# Patient Record
Sex: Male | Born: 1985 | Race: White | Hispanic: No | Marital: Single | Smoking: Current every day smoker
Health system: Southern US, Community
[De-identification: ages and names within clinical notes are randomized; demographics above are authoritative.]

## PROBLEM LIST (undated history)

## (undated) DIAGNOSIS — F419 Anxiety disorder, unspecified: Secondary | ICD-10-CM

## (undated) DIAGNOSIS — I10 Essential (primary) hypertension: Secondary | ICD-10-CM

## (undated) DIAGNOSIS — F32A Depression, unspecified: Secondary | ICD-10-CM

## (undated) DIAGNOSIS — F329 Major depressive disorder, single episode, unspecified: Secondary | ICD-10-CM

## (undated) DIAGNOSIS — F4310 Post-traumatic stress disorder, unspecified: Secondary | ICD-10-CM

## (undated) DIAGNOSIS — S6291XA Unspecified fracture of right wrist and hand, initial encounter for closed fracture: Secondary | ICD-10-CM

## (undated) DIAGNOSIS — F411 Generalized anxiety disorder: Secondary | ICD-10-CM

## (undated) DIAGNOSIS — F319 Bipolar disorder, unspecified: Secondary | ICD-10-CM

## (undated) HISTORY — DX: Unspecified fracture of right wrist and hand, initial encounter for closed fracture: S62.91XA

## (undated) HISTORY — PX: TONSILLECTOMY: SUR1361

## (undated) HISTORY — PX: HAND SURGERY: SHX662

## (undated) HISTORY — DX: Anxiety disorder, unspecified: F41.9

---

## 2006-02-16 ENCOUNTER — Emergency Department (HOSPITAL_COMMUNITY): Admission: EM | Admit: 2006-02-16 | Discharge: 2006-02-16 | Payer: Self-pay | Admitting: Family Medicine

## 2013-08-13 ENCOUNTER — Emergency Department (HOSPITAL_COMMUNITY)
Admission: EM | Admit: 2013-08-13 | Discharge: 2013-08-13 | Disposition: A | Discharge status: Home / Self Care | Payer: Self-pay | Attending: Emergency Medicine | Admitting: Emergency Medicine

## 2013-08-13 ENCOUNTER — Emergency Department (HOSPITAL_COMMUNITY): Payer: Self-pay

## 2013-08-13 ENCOUNTER — Encounter (HOSPITAL_COMMUNITY): Payer: Self-pay | Admitting: Emergency Medicine

## 2013-08-13 DIAGNOSIS — R51 Headache: Secondary | ICD-10-CM | POA: Insufficient documentation

## 2013-08-13 DIAGNOSIS — R1012 Left upper quadrant pain: Secondary | ICD-10-CM | POA: Insufficient documentation

## 2013-08-13 DIAGNOSIS — R110 Nausea: Secondary | ICD-10-CM | POA: Insufficient documentation

## 2013-08-13 DIAGNOSIS — J3489 Other specified disorders of nose and nasal sinuses: Secondary | ICD-10-CM | POA: Insufficient documentation

## 2013-08-13 DIAGNOSIS — F17200 Nicotine dependence, unspecified, uncomplicated: Secondary | ICD-10-CM | POA: Insufficient documentation

## 2013-08-13 DIAGNOSIS — R509 Fever, unspecified: Secondary | ICD-10-CM | POA: Insufficient documentation

## 2013-08-13 DIAGNOSIS — K5900 Constipation, unspecified: Secondary | ICD-10-CM | POA: Insufficient documentation

## 2013-08-13 DIAGNOSIS — R21 Rash and other nonspecific skin eruption: Secondary | ICD-10-CM | POA: Insufficient documentation

## 2013-08-13 DIAGNOSIS — J069 Acute upper respiratory infection, unspecified: Secondary | ICD-10-CM | POA: Insufficient documentation

## 2013-08-13 LAB — COMPREHENSIVE METABOLIC PANEL
ALT: 12 U/L (ref 0–53)
AST: 13 U/L (ref 0–37)
Albumin: 4.1 g/dL (ref 3.5–5.2)
Alkaline Phosphatase: 53 U/L (ref 39–117)
BUN: 14 mg/dL (ref 6–23)
Chloride: 102 mEq/L (ref 96–112)
Potassium: 3.8 mEq/L (ref 3.5–5.1)
Sodium: 137 mEq/L (ref 135–145)
Total Bilirubin: 0.7 mg/dL (ref 0.3–1.2)
Total Protein: 7.2 g/dL (ref 6.0–8.3)

## 2013-08-13 LAB — URINALYSIS, ROUTINE W REFLEX MICROSCOPIC
Glucose, UA: NEGATIVE mg/dL
Hgb urine dipstick: NEGATIVE
Ketones, ur: >80 mg/dL — AB
Leukocytes, UA: NEGATIVE
Nitrite: NEGATIVE
Protein, ur: NEGATIVE mg/dL
Specific Gravity, Urine: 1.028 (ref 1.005–1.030)
Urobilinogen, UA: 1 mg/dL (ref 0.0–1.0)
pH: 6 (ref 5.0–8.0)

## 2013-08-13 LAB — CBC WITH DIFFERENTIAL/PLATELET
Basophils Absolute: 0 K/uL (ref 0.0–0.1)
Basophils Relative: 0 % (ref 0–1)
Eosinophils Absolute: 0.1 10*3/uL (ref 0.0–0.7)
Eosinophils Relative: 1 % (ref 0–5)
HCT: 48.3 % (ref 39.0–52.0)
Hemoglobin: 16.8 g/dL (ref 13.0–17.0)
Lymphocytes Relative: 17 % (ref 12–46)
Lymphs Abs: 1.4 K/uL (ref 0.7–4.0)
MCH: 29.5 pg (ref 26.0–34.0)
MCHC: 34.8 g/dL (ref 30.0–36.0)
MCV: 84.9 fL (ref 78.0–100.0)
Monocytes Absolute: 0.8 K/uL (ref 0.1–1.0)
Monocytes Relative: 9 % (ref 3–12)
Neutro Abs: 5.9 10*3/uL (ref 1.7–7.7)
Neutrophils Relative %: 73 % (ref 43–77)
Platelets: 202 10*3/uL (ref 150–400)
RBC: 5.69 MIL/uL (ref 4.22–5.81)
RDW: 13.2 % (ref 11.5–15.5)
WBC: 8.2 K/uL (ref 4.0–10.5)

## 2013-08-13 LAB — COMPREHENSIVE METABOLIC PANEL WITH GFR
CO2: 23 meq/L (ref 19–32)
Calcium: 9.2 mg/dL (ref 8.4–10.5)
Creatinine, Ser: 0.84 mg/dL (ref 0.50–1.35)
GFR calc Af Amer: >90 mL/min (ref >90)
GFR calc non Af Amer: >90 mL/min (ref >90)
Glucose, Bld: 93 mg/dL (ref 70–99)

## 2013-08-13 MED ORDER — HYDROCODONE-HOMATROPINE 5-1.5 MG/5ML PO SYRP: 2.5000 mL | ORAL_SOLUTION | Freq: Four times a day (QID) | ORAL | Status: DC | PRN

## 2013-08-13 MED ORDER — SODIUM CHLORIDE 0.9 % IV SOLN
1000.0000 mL | Freq: Once | INTRAVENOUS | Status: AC
  Administered 2013-08-13: 1000 mL via INTRAVENOUS

## 2013-08-13 MED ORDER — SODIUM CHLORIDE 0.9 % IV SOLN
1000.0000 mL | INTRAVENOUS | Status: DC
  Administered 2013-08-13: 1000 mL via INTRAVENOUS

## 2013-08-13 MED ORDER — SENNOSIDES-DOCUSATE SODIUM 8.6-50 MG PO TABS: 1.0000 | ORAL_TABLET | Freq: Every day | ORAL | Status: DC

## 2013-08-13 NOTE — ED Notes (Signed)
Pt reports on Tuesday began feeling like he was catching a cold, pt also reports fever between 99-100 at home. Pt tried dayquil, ibuprofen,  and nyquil without relief. Pt reports Wednesday morning with abdominal cramping. Pt denies vomiting, reports feels queasy and having diarrhea.

## 2013-08-13 NOTE — Progress Notes (Signed)
Met patient at bedside.Patient reports increased abdominal pain as reason for ED visit today.Patient reports he does not have health insurance at present due to starting a new Job. Education  Provided to patient on the [facility]/ urgent care clinic-.Resource sheet for the clinic and a [facility]  211 resource card provided to patient.Patient verbalizes his understanding of written Verbal education today.No further case manager needs identified at this time.

## 2013-08-13 NOTE — ED Provider Notes (Signed)
CSN: [id number]     Arrival date & time 08/13/13  1125 History   First MD Initiated Contact with Patient 08/13/13 1207     Chief Complaint  Patient presents with  . Cough  . Fever  . Abdominal Pain   (Consider location/radiation/quality/duration/timing/severity/associated sxs/prior Treatment) HPI Comments: He should presents emergency department with chief complaint of symptoms of upper respiratory infection and abdominal pain.  Patient states that his symptoms began 4 days ago.  He states that he started with a runny nose and within 2 hours had headache, congestion, cough, myalgias, high pain and low-grade fever.  Patient states that the next day he ran a temperature of 101.  Patient was using NyQuil which did relieve his symptoms mildly however ECT still wakes up in the [place] night feeling awful.  2 days ago the patient began having colicky, moderate, squeezing abdominal pain which started on the left upper quadrant and radiated across his right upper quadrant.  The patient has not made a bowel movement in several days.  He reports decreased appetite, fatigue, myalgias.  He states this is the worst he's ever felt in his life.  He is a current daily smoker.  He has had nausea without vomiting.  Patient is a 27 y.o. male presenting with URI and abdominal pain. The history is provided by the patient.  URI Presenting symptoms: congestion, cough, fever and rhinorrhea   Presenting symptoms: no sore throat   Fever:    Duration:  1 day   Max temp PTA (F):  101.0   Temp source:  Oral Associated symptoms: no headaches   Abdominal Pain Pain location:  LUQ and RUQ Pain quality: aching and squeezing   Pain radiates to:  Does not radiate Pain severity:  Moderate Onset quality:  Gradual Timing:  Intermittent Context: not awakening from sleep   Relieved by:  Nothing Associated symptoms: chills, constipation, cough, fever and nausea   Associated symptoms: no anorexia, no belching, no chest pain, no  diarrhea, no dysuria, no flatus, no hematemesis, no hematochezia, no hematuria, no melena and no sore throat     History reviewed. No pertinent past medical history. Past Surgical History  Procedure Laterality Date  . Tonsillectomy    . Hand surgery Left    No family history on file. History  Substance Use Topics  . Smoking status: Current Every Day Smoker  . Smokeless tobacco: Not on file  . Alcohol Use: Yes    Review of Systems  Constitutional: Positive for fever and chills.  HENT: Positive for congestion, rhinorrhea and sinus pressure. Negative for sore throat.   Eyes: Negative for visual disturbance.  Respiratory: Positive for cough.   Cardiovascular: Negative for chest pain.  Gastrointestinal: Positive for nausea, abdominal pain and constipation. Negative for diarrhea, melena, hematochezia, anorexia, flatus and hematemesis.  Genitourinary: Negative for dysuria and hematuria.  Neurological: Negative for headaches.    Allergies  Review of patient's allergies indicates no known allergies.  Home Medications  No current outpatient prescriptions on file. BP 137/87  Pulse 85  Temp(Src) 98.4 F (36.9 C)  Resp 18  Ht 5\' 10"  (1.778 m)  Wt 250 lb (113.399 kg)  BMI 35.87 kg/m2  SpO2 98% Physical Exam Appears moderately ill but not toxic; temperature as noted in vitals. Ears normal. Eyes:glassy appearance, no discharge  Heart: RRR, NO M/G/R Throat and pharynx normal.   Neck supple. No adenopathyhy in the neck.  Sinuses non tender.  The chest is clear.  Productive cough  Abdomen is soft and nontender to palpation  ED Course  Procedures (including critical care time) Labs Review Labs Reviewed  URINALYSIS, ROUTINE W REFLEX MICROSCOPIC - Abnormal; Notable for the following:    Color, Urine AMBER (*)    Bilirubin Urine SMALL (*)    Ketones, ur >80 (*)    All other components within normal limits  CBC WITH DIFFERENTIAL  COMPREHENSIVE METABOLIC PANEL  LIPASE, BLOOD    Imaging Review No results found.  MDM   1. URI (upper respiratory infection)   2. Constipation    1:42 PM Patient with sxs consistent with URI.Possible constipation causing his sxs,  No leukocytosis and no abdominal tenderness on exam. CMP shows no abnormality, no elevation in his lipase.   Xray shows large stool burden Pt CXR negative for acute infiltrate. Patients symptoms are consistent with URI, likely viral etiology. Discussed that antibiotics are not indicated for viral infections. Pt will be discharged with symptomatic treatment.  Verbalizes understanding and is agreeable with plan. Pt is hemodynamically stable & in NAD prior to dc.   Arthor Captain, PA-C 08/13/13 1542

## 2013-08-13 NOTE — ED Notes (Signed)
PA at bedside.

## 2013-08-15 NOTE — ED Provider Notes (Signed)
Medical screening examination/treatment/procedure(s) were performed by non-physician practitioner and as supervising physician I was immediately available for consultation/collaboration.   Adam Lyons. Oletta Lamas, MD 08/15/13 2236

## 2014-03-28 ENCOUNTER — Ambulatory Visit (INDEPENDENT_AMBULATORY_CARE_PROVIDER_SITE_OTHER): Payer: PRIVATE HEALTH INSURANCE | Admitting: Licensed Clinical Social Worker

## 2014-03-28 DIAGNOSIS — F314 Bipolar disorder, current episode depressed, severe, without psychotic features: Secondary | ICD-10-CM

## 2014-04-20 ENCOUNTER — Ambulatory Visit (INDEPENDENT_AMBULATORY_CARE_PROVIDER_SITE_OTHER): Payer: PRIVATE HEALTH INSURANCE | Admitting: Licensed Clinical Social Worker

## 2014-04-20 DIAGNOSIS — F314 Bipolar disorder, current episode depressed, severe, without psychotic features: Secondary | ICD-10-CM

## 2014-05-11 ENCOUNTER — Ambulatory Visit: Payer: PRIVATE HEALTH INSURANCE | Admitting: Licensed Clinical Social Worker

## 2014-07-20 ENCOUNTER — Encounter (HOSPITAL_COMMUNITY): Payer: Self-pay | Admitting: Emergency Medicine

## 2014-07-20 ENCOUNTER — Emergency Department (HOSPITAL_COMMUNITY)
Admission: EM | Admit: 2014-07-20 | Discharge: 2014-07-21 | Disposition: A | Discharge status: Home / Self Care | Payer: BC Managed Care – PPO | Attending: Emergency Medicine | Admitting: Emergency Medicine

## 2014-07-20 DIAGNOSIS — Z79899 Other long term (current) drug therapy: Secondary | ICD-10-CM | POA: Diagnosis not present

## 2014-07-20 DIAGNOSIS — R21 Rash and other nonspecific skin eruption: Secondary | ICD-10-CM | POA: Diagnosis present

## 2014-07-20 DIAGNOSIS — F17200 Nicotine dependence, unspecified, uncomplicated: Secondary | ICD-10-CM | POA: Insufficient documentation

## 2014-07-20 NOTE — ED Notes (Signed)
Pt presents with c/o rash that he noticed yesterday. Pt reports the rash is on his legs, sides, stomach, and back. Pt does not remember being exposed to anything unusual. 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

## 2014-07-21 MED ORDER — PERMETHRIN 5 % EX CREA: TOPICAL_CREAM | CUTANEOUS | Status: DC

## 2014-07-21 MED ORDER — DIPHENHYDRAMINE HCL 25 MG PO TABS: 25.0000 mg | ORAL_TABLET | Freq: Four times a day (QID) | ORAL | Status: DC | PRN

## 2014-07-21 NOTE — ED Provider Notes (Signed)
Medical screening examination/treatment/procedure(s) were performed by non-physician practitioner and as supervising physician I was immediately available for consultation/collaboration.   EKG Interpretation None       Olivia Mackielga M Omya Winfield, MD 07/21/14 [phone]

## 2014-07-21 NOTE — Discharge Instructions (Signed)
Please follow up with your primary care physician in 1-2 days. If you do not have one please call the [facility] number listed above. Please use medications as prescribed. Please read all discharge instructions and return precautions.  ° °Rash °A rash is a change in the color or texture of your skin. There are many different types of rashes. You may have other problems that accompany your rash. °CAUSES  °· Infections. °· Allergic reactions. This can include allergies to pets or foods. °· Certain medicines. °· Exposure to certain chemicals, soaps, or cosmetics. °· Heat. °· Exposure to poisonous plants. °· Tumors, both cancerous and noncancerous. °SYMPTOMS  °· Redness. °· Scaly skin. °· Itchy skin. °· Dry or cracked skin. °· Bumps. °· Blisters. °· Pain. °DIAGNOSIS  °Your caregiver may do a physical exam to determine what type of rash you have. A skin sample (biopsy) may be taken and examined under a microscope. °TREATMENT  °Treatment depends on the type of rash you have. Your caregiver may prescribe certain medicines. For serious conditions, you may need to see a skin doctor (dermatologist). °HOME CARE INSTRUCTIONS  °· Avoid the substance that caused your rash. °· Do not scratch your rash. This can cause infection. °· You may take cool baths to help stop itching. °· Only take over-the-counter or prescription medicines as directed by your caregiver. °· Keep all follow-up appointments as directed by your caregiver. °SEEK IMMEDIATE MEDICAL CARE IF: °· You have increasing pain, swelling, or redness. °· You have a fever. °· You have new or severe symptoms. °· You have body aches, diarrhea, or vomiting. °· Your rash is not better after 3 days. °MAKE SURE YOU: °· Understand these instructions. °· Will watch your condition. °· Will get help right away if you are not doing well or get worse. °Document Released: 11/07/2002 Document Revised: 02/09/2012 Document Reviewed: 09/01/2011 °[employer]® Patient  Information ©2015 [employer]. This information is not intended to replace advice given to you by your health care provider. Make sure you discuss any questions you have with your health care provider. ° ° °

## 2014-07-21 NOTE — ED Provider Notes (Signed)
CSN: [id number]     Arrival date & time 07/20/14  2103 History   First MD Initiated Contact with Patient 07/21/14 0009     Chief Complaint  Patient presents with  . Rash     (Consider location/radiation/quality/duration/timing/severity/associated sxs/prior Treatment) Patient is a 28 y.o. male presenting with rash. The history is provided by the patient.  Rash Location:  Leg, shoulder/arm, torso, ano-genital, foot, toe and hand Shoulder/arm rash location:  L arm and R arm Hand rash location:  L hand and R hand Torso rash location:  Lower back, abd LLQ and abd RLQ Leg rash location:  L upper leg, R upper leg, L lower leg, R lower leg, L foot and R foot Foot rash location:  L foot and R foot Quality: itchiness and redness   Quality: not blistering, not bruising, not burning, not draining, not dry, not painful, not peeling, not scaling, not swelling and not weeping   Severity:  Moderate Onset quality:  Gradual Duration:  1 day Timing:  Constant Progression:  Worsening Chronicity:  New Context comment:  Patient works under houses, 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 No pertinent past medical history. Past Surgical History  Procedure Laterality Date  . Tonsillectomy    . Hand surgery Left    No family history on file. History  Substance Use Topics  . Smoking status: Current Every Day Smoker  . Smokeless tobacco: Not on file  . Alcohol Use: Yes     Comment: socially     Review of Systems  Constitutional: Negative for fever and fatigue.  HENT: Negative for hoarse voice and sore  throat.   Respiratory: Negative for shortness of breath and wheezing.   Gastrointestinal: Negative for nausea, vomiting, abdominal pain and diarrhea.  Musculoskeletal: Negative for myalgias.  Skin: Positive for rash.  Neurological: Negative for headaches.      Allergies  Review of patient's allergies indicates no known allergies.  Home Medications   Prior to Admission medications   Medication Sig Start Date End Date Taking? Authorizing Provider  ALPRAZolam Prudy Feeler(XANAX) 0.5 MG tablet Take 0.5 mg by mouth 3 (three) times daily as needed for anxiety.   Yes Historical Provider, MD  amoxicillin (AMOXIL) 500 MG capsule Take 500 mg by mouth every 8 (eight) hours. Until gone.   Yes Historical Provider, MD  busPIRone (BUSPAR) 15 MG tablet Take 15 mg by mouth 3 (three) times daily.   Yes Historical Provider, MD  HYDROcodone-acetaminophen (NORCO/VICODIN) 5-325 MG per tablet Take 1 tablet by mouth every 6 (six) hours as needed for moderate pain.   Yes Historical Provider, MD  diphenhydrAMINE (BENADRYL) 25 MG tablet Take 1 tablet (25 mg total) by mouth every 6 (six) hours as needed for itching (Rash). 07/21/14   Camdin Hegner L Hikari Tripp, PA-C  permethrin (ELIMITE) 5 % cream Apply to affected area once 07/21/14   Victorino DikeJennifer L Raesha Coonrod, PA-C   BP 138/82  Pulse 80  Temp(Src) 98.7 F (37.1 C) (Oral)  Resp 16  SpO2 97% Physical Exam  Nursing note and vitals reviewed. Constitutional: He is oriented to person, place, and time. He appears well-developed and  well-nourished. No distress.  HENT:  Head: Normocephalic and atraumatic.  Right Ear: External ear normal.  Left Ear: External ear normal.  Nose: Nose normal.  Mouth/Throat: Oropharynx is clear and moist.  Eyes: Conjunctivae are normal.  Neck: Normal range of motion. Neck supple.  Cardiovascular: Normal rate.   Pulmonary/Chest: Effort normal.  Abdominal: Soft.  Musculoskeletal: Normal range of motion.  Neurological: He is alert and oriented to  person, place, and time.  Skin: Skin is warm and dry. Rash (Diffuse maculopapular rash. No drainage) noted. He is not diaphoretic.  Psychiatric: He has a normal mood and affect.    ED Course  Procedures (including critical care time) Labs Review Labs Reviewed - No data to display  Imaging Review No results found.   EKG Interpretation None      MDM   Final diagnoses:  Rash and nonspecific skin eruption    Filed Vitals:   07/21/14 0054  BP: 138/82  Pulse: 80  Temp:   Resp: 16   Afebrile, NAD, non-toxic appearing, AAOx4. No evidence of SJS or necrotizing fasciitis. Due to pruritic and not painful nature of blisters do not suspect pemphigus vulgaris. Pustules do not resemble scabies as per pt hx or allergic reaction. No blisters, no pustules, no warmth, no draining sinus tracts, no superficial abscesses, no bullous impetigo, no vesicles, no desquamation, no target lesions with dusky purpura or a central bulla. Not tender to touch. Will treat with Permethrin incase of insect exposure while working. Discussed symptomatic treatments. Return precautions discussed. Patient is agreeable to plan. Patient is stable at time of discharge.        Jeannetta Ellis, PA-C 07/21/14 [phone]

## 2015-01-25 ENCOUNTER — Encounter (HOSPITAL_COMMUNITY): Payer: Self-pay

## 2015-01-25 ENCOUNTER — Emergency Department (HOSPITAL_COMMUNITY)
Admission: EM | Admit: 2015-01-25 | Discharge: 2015-01-25 | Disposition: A | Discharge status: Home / Self Care | Payer: BLUE CROSS/BLUE SHIELD | Attending: Emergency Medicine | Admitting: Emergency Medicine

## 2015-01-25 DIAGNOSIS — M542 Cervicalgia: Secondary | ICD-10-CM

## 2015-01-25 DIAGNOSIS — Z720 Tobacco use: Secondary | ICD-10-CM | POA: Diagnosis not present

## 2015-01-25 DIAGNOSIS — Z79899 Other long term (current) drug therapy: Secondary | ICD-10-CM | POA: Diagnosis not present

## 2015-01-25 MED ORDER — HYDROCODONE-ACETAMINOPHEN 5-325 MG PO TABS: 1.0000 | ORAL_TABLET | ORAL | Status: DC | PRN

## 2015-01-25 MED ORDER — KETOROLAC TROMETHAMINE 60 MG/2ML IM SOLN
60.0000 mg | Freq: Once | INTRAMUSCULAR | Status: AC
  Administered 2015-01-25: 60 mg via INTRAMUSCULAR
  Filled 2015-01-25: qty 2

## 2015-01-25 MED ORDER — DIAZEPAM 5 MG PO TABS: 5.0000 mg | ORAL_TABLET | Freq: Two times a day (BID) | ORAL | Status: DC

## 2015-01-25 MED ORDER — DIAZEPAM 5 MG/ML IJ SOLN
5.0000 mg | Freq: Once | INTRAMUSCULAR | Status: AC
  Administered 2015-01-25: 5 mg via INTRAMUSCULAR
  Filled 2015-01-25: qty 2

## 2015-01-25 NOTE — Discharge Instructions (Signed)
Take the prescribed medication as directed.  May also wish to apply heat to affected area to help with pain/stiffness. Return to the ED for new or worsening symptoms.

## 2015-01-25 NOTE — ED Provider Notes (Signed)
CSN: [id number]     Arrival date & time 01/25/15  1207 History  This chart was scribed for Sharilyn Sites, PA-C working with Donnetta Hutching, MD by Elveria Rising, ED Scribe. This patient was seen in room TR08C/TR08C and the patient's care was started at 1:22 PM.   Chief Complaint  Patient presents with  . Torticollis   The history is provided by the patient. No language interpreter was used.   HPI Comments: Adam Lyons is a 29 y.o. male who presents to the Emergency Department complaining of worsening right neck pain, onset two days ago. Patient states that he was attempting to get out of chair at onset. Patient reports radiation of his neck pain into his right shoulder and back. Patient states that he typically carries a 20 foot ladder on his right shoulder while at work. However he states that he usually experiences neck pain if he happens to "sleep on his neck wrong." Patient shares recurrent history of "cricks in his neck" every few months. Patient denies trouble swallowing, but states that he is able to feel pressure/tension in his neck with swallowing.  He denies numbness, weakness, or paresthesias of upper extremities.  No fever, chills, headaches, dizziness, lightheadedness, or confusion.  VSS on arrival.  History reviewed. No pertinent past medical history. Past Surgical History  Procedure Laterality Date  . Tonsillectomy    . Hand surgery Left    History reviewed. No pertinent family history. History  Substance Use Topics  . Smoking status: Current Every Day Smoker  . Smokeless tobacco: Not on file  . Alcohol Use: Yes     Comment: socially     Review of Systems  Constitutional: Negative for fever and chills.  HENT: Negative for trouble swallowing.   Musculoskeletal: Positive for myalgias and neck pain.  All other systems reviewed and are negative.   Allergies  Review of patient's allergies indicates no known allergies.  Home Medications   Prior to Admission medications    Medication Sig Start Date End Date Taking? Authorizing Provider  ALPRAZolam Prudy Feeler) 0.5 MG tablet Take 0.5 mg by mouth 3 (three) times daily as needed for anxiety.    Historical Provider, MD  amoxicillin (AMOXIL) 500 MG capsule Take 500 mg by mouth every 8 (eight) hours. Until gone.    Historical Provider, MD  busPIRone (BUSPAR) 15 MG tablet Take 15 mg by mouth 3 (three) times daily.    Historical Provider, MD  diphenhydrAMINE (BENADRYL) 25 MG tablet Take 1 tablet (25 mg total) by mouth every 6 (six) hours as needed for itching (Rash). 07/21/14   Jennifer L Piepenbrink, PA-C  HYDROcodone-acetaminophen (NORCO/VICODIN) 5-325 MG per tablet Take 1 tablet by mouth every 6 (six) hours as needed for moderate pain.    Historical Provider, MD  permethrin (ELIMITE) 5 % cream Apply to affected area once 07/21/14   Lise Auer Piepenbrink, PA-C   Triage Vitals: BP 130/73 mmHg  Pulse 74  Temp(Src) 97.7 F (36.5 C) (Oral)  Resp 22  SpO2 99% Physical Exam  Constitutional: He is oriented to person, place, and time. He appears well-developed and well-nourished.  HENT:  Head: Normocephalic and atraumatic.  Mouth/Throat: Oropharynx is clear and moist.  Eyes: Conjunctivae and EOM are normal. Pupils are equal, round, and reactive to light.  Neck: Neck supple. Muscular tenderness present. No rigidity. Decreased range of motion (due to pain) present.    No meningismus or nuchal rigidity; muscular tenderness of right side of neck as depicted along SCM and  trapezius; no bony tenderness or deformities; limited ROM due to pain; normal grip strength of BUE; normal sensation throughout bilateral arms Carotid pulses intact bilaterally  Cardiovascular: Normal rate, regular rhythm and normal heart sounds.   Pulmonary/Chest: Effort normal and breath sounds normal. No respiratory distress. He has no wheezes.  Abdominal: Soft. Bowel sounds are normal.  Neurological: He is alert and oriented to person, place, and time.   AAOx3, answering questions appropriately; equal strength UE and LE bilaterally; CN grossly intact; moves all extremities appropriately without ataxia; no focal neuro deficits or facial asymmetry appreciated  Skin: Skin is warm and dry.  Psychiatric: He has a normal mood and affect.  Nursing note and vitals reviewed.   ED Course  Procedures (including critical care time)  COORDINATION OF CARE: 1:22 PM- Discussed treatment plan with patient at bedside and patient agreed to plan.   Labs Review Labs Reviewed - No data to display  Imaging Review No results found.   EKG Interpretation None      MDM   Final diagnoses:  Neck pain on right side   29 y.o. M with right sided neck pain x 2 days.  Denies injuries, frequent "cricks in his neck".  Patient also notably carries heavy ladder on right shoulder at work.  On exam, no clinical signs of meningitis.  No recent fevers or headaches.  He does have muscular tenderness along right side of neck along SCM and trapezius.  No bony tenderness or deformities.  Neurologic exam non-focal.  Low suspicion for vascular injury or central cord syndrome. Patient given toradol and valium with improvement but not complete resolution of his symptoms.  Patient will be d/c home with robaxin and vicodin.  Discussed plan with patient, he/she acknowledged understanding and agreed with plan of care.  Return precautions given for new or worsening symptoms.  I personally performed the services described in this documentation, which was scribed in my presence. The recorded information has been reviewed and is accurate.  Garlon HatchetLisa M Ibrohim Simmers, PA-C 01/25/15 1511  Garlon HatchetLisa M Kiyoko Mcguirt, PA-C 01/25/15 1516  Donnetta HutchingBrian Cook, MD 01/25/15 [phone]

## 2015-01-25 NOTE — ED Notes (Signed)
Pt reports right-sided neck pain.  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  Pain got worse yesterday.  Pain radiates into right shoulder and down back.  Pt report some tingling.  Full ROM in neck in triage.

## 2015-02-20 ENCOUNTER — Emergency Department (HOSPITAL_COMMUNITY): Payer: BLUE CROSS/BLUE SHIELD

## 2015-02-20 ENCOUNTER — Encounter (HOSPITAL_COMMUNITY): Payer: Self-pay | Admitting: Emergency Medicine

## 2015-02-20 ENCOUNTER — Emergency Department (HOSPITAL_COMMUNITY)
Admission: EM | Admit: 2015-02-20 | Discharge: 2015-02-20 | Disposition: A | Discharge status: Home / Self Care | Payer: BLUE CROSS/BLUE SHIELD | Attending: Emergency Medicine | Admitting: Emergency Medicine

## 2015-02-20 DIAGNOSIS — Z720 Tobacco use: Secondary | ICD-10-CM | POA: Insufficient documentation

## 2015-02-20 DIAGNOSIS — Y998 Other external cause status: Secondary | ICD-10-CM | POA: Diagnosis not present

## 2015-02-20 DIAGNOSIS — S60511A Abrasion of right hand, initial encounter: Secondary | ICD-10-CM | POA: Diagnosis not present

## 2015-02-20 DIAGNOSIS — Y9289 Other specified places as the place of occurrence of the external cause: Secondary | ICD-10-CM | POA: Insufficient documentation

## 2015-02-20 DIAGNOSIS — Y9389 Activity, other specified: Secondary | ICD-10-CM | POA: Insufficient documentation

## 2015-02-20 DIAGNOSIS — Z9889 Other specified postprocedural states: Secondary | ICD-10-CM | POA: Diagnosis not present

## 2015-02-20 DIAGNOSIS — Z79899 Other long term (current) drug therapy: Secondary | ICD-10-CM | POA: Diagnosis not present

## 2015-02-20 DIAGNOSIS — Z23 Encounter for immunization: Secondary | ICD-10-CM | POA: Insufficient documentation

## 2015-02-20 DIAGNOSIS — S6991XA Unspecified injury of right wrist, hand and finger(s), initial encounter: Secondary | ICD-10-CM | POA: Diagnosis present

## 2015-02-20 DIAGNOSIS — S62324A Displaced fracture of shaft of fourth metacarpal bone, right hand, initial encounter for closed fracture: Secondary | ICD-10-CM | POA: Insufficient documentation

## 2015-02-20 DIAGNOSIS — W231XXA Caught, crushed, jammed, or pinched between stationary objects, initial encounter: Secondary | ICD-10-CM | POA: Diagnosis not present

## 2015-02-20 DIAGNOSIS — S62308A Unspecified fracture of other metacarpal bone, initial encounter for closed fracture: Secondary | ICD-10-CM

## 2015-02-20 DIAGNOSIS — T148XXA Other injury of unspecified body region, initial encounter: Secondary | ICD-10-CM

## 2015-02-20 DIAGNOSIS — S6291XA Unspecified fracture of right wrist and hand, initial encounter for closed fracture: Secondary | ICD-10-CM

## 2015-02-20 HISTORY — DX: Unspecified fracture of right wrist and hand, initial encounter for closed fracture: S62.91XA

## 2015-02-20 MED ORDER — HYDROCODONE-ACETAMINOPHEN 5-325 MG PO TABS
1.0000 | ORAL_TABLET | Freq: Once | ORAL | Status: AC
  Administered 2015-02-20: 1 via ORAL
  Filled 2015-02-20: qty 1

## 2015-02-20 MED ORDER — LIDOCAINE HCL 2 % IJ SOLN
5.0000 mL | Freq: Once | INTRAMUSCULAR | Status: AC
  Administered 2015-02-20: 20 mg
  Filled 2015-02-20: qty 20

## 2015-02-20 MED ORDER — NAPROXEN 500 MG PO TABS: 500.0000 mg | ORAL_TABLET | Freq: Two times a day (BID) | ORAL | Status: DC | PRN

## 2015-02-20 MED ORDER — HYDROCODONE-ACETAMINOPHEN 5-325 MG PO TABS: 1.0000 | ORAL_TABLET | Freq: Four times a day (QID) | ORAL | Status: DC | PRN

## 2015-02-20 MED ORDER — TETANUS-DIPHTH-ACELL PERTUSSIS 5-2.5-18.5 LF-MCG/0.5 IM SUSP
0.5000 mL | Freq: Once | INTRAMUSCULAR | Status: AC
  Administered 2015-02-20: 0.5 mL via INTRAMUSCULAR
  Filled 2015-02-20: qty 0.5

## 2015-02-20 NOTE — ED Provider Notes (Addendum)
Patient struck his right hand with a 2 x 8 pieces of what is it fell from above his head, landed on the dorsum of his hand striking his fourth metacarpal, causing acute onset of pain and deformity. On exam the patient has a nodular deformity consistent with a fracture over the dorsum of the right hand, mid shaft fourth metacarpal. X-rays confirm this finding, normal sensation distal to the fracture, small amount of swelling, no open joint or open fracture, skin is intact. I personally performed a hematoma block, the patient will have a splint applied with a partial reduction with splint application, will follow up with Dr. Ophelia CharterYates in the office. Splint placed by Ortho tech, recheck - NV status intact - reduction not possible,  Medical screening examination/treatment/procedure(s) were conducted as a shared visit with non-physician practitioner(s) and myself.  I personally evaluated the patient during the encounter.  Clinical Impression:   Final diagnoses:  Closed fracture of 4th metacarpal, initial encounter  Skin abrasion         Eber HongBrian Charda Janis, MD 02/21/15 [phone]  Eber HongBrian Anddy Wingert, MD 03/04/15 [phone]

## 2015-02-20 NOTE — Discharge Instructions (Signed)
Wear arm splint at all times until you see the orthopedist. Ice and elevate your hand throughout the day, using an ice pack for 20 minutes at a time every hour, keep your hand above your heart. Alternate between naprosyn and norco for pain relief. Do not drive or operate machinery with pain medication use. If your fingers turn white or loose complete sensation, loosen the dressing first and if it continues to feel numb and have loss of color, return to the ER immediately. Call orthopedic follow up tomorrow to schedule followup appointment for 2-3 days. Return to the ER for changes or worsening symptoms.    Hand Fracture, Metacarpals Fractures of metacarpals are breaks in the bones of the hand. They extend from the knuckles to the wrist. These bones can undergo many types of fractures. There are different ways of treating these fractures, all of which may be correct. TREATMENT  Hand fractures can be treated with:   Non-reduction - The fracture is casted without changing the positions of the fracture (bone pieces) involved. This fracture is usually left in a cast for 4 to 6 weeks or as your caregiver thinks necessary.  Closed reduction - The bones are moved back into position without surgery and then casted.  ORIF (open reduction and internal fixation) - The fracture site is opened and the bone pieces are fixed into place with some type of hardware, such as screws, etc. They are then casted. Your caregiver will discuss the type of fracture you have and the treatment that should be best for that problem. If surgery is chosen, let your caregivers know about the following.  LET YOUR CAREGIVERS KNOW ABOUT:  Allergies.  Medications you are taking, including herbs, eye drops, over the counter medications, and creams.  Use of steroids (by mouth or creams).  Previous problems with anesthetics or novocaine.  Possibility of pregnancy.  History of blood clots (thrombophlebitis).  History of bleeding  or blood problems.  Previous surgeries.  Other health problems. AFTER THE PROCEDURE After surgery, you will be taken to the recovery area where a nurse will watch and check your progress. Once you are awake, stable, and taking fluids well, barring other problems, you'll be allowed to go home. Once home, an ice pack applied to your operative site may help with pain and keep the swelling down. HOME CARE INSTRUCTIONS   Follow your caregiver's instructions as to activities, exercises, physical therapy, and driving a car.  Daily exercise is helpful for keeping range of motion and strength. Exercise as instructed.  To lessen swelling, keep the injured hand elevated above the level of your heart as much as possible.  Apply ice to the injury for 15-20 minutes each hour while awake for the first 2 days. Put the ice in a plastic bag and place a thin towel between the bag of ice and your cast.  Move the fingers of your casted hand several times a day.  If a plaster or fiberglass cast was applied:  Do not try to scratch the skin under the cast using a sharp or pointed object.  Check the skin around the cast every day. You may put lotion on red or sore areas.  Keep your cast dry. Your cast can be protected during bathing with a plastic bag. Do not put your cast into the water.  If a plaster splint was applied:  Wear your splint for as long as directed by your caregiver or until seen again.  Do not get your  splint wet. Protect it during bathing with a plastic bag.  You may loosen the elastic bandage around the splint if your fingers start to get numb, tingle, get cold or turn blue.  Do not put pressure on your cast or splint; this may cause it to break. Especially, do not lean plaster casts on hard surfaces for 24 hours after application.  Take medications as directed by your caregiver.  Only take over-the-counter or prescription medicines for pain, discomfort, or fever as directed by your  caregiver.  Follow-up as provided by your caregiver. This is very important in order to avoid permanent injury or disability and chronic pain. SEEK MEDICAL CARE IF:   Increased bleeding (more than a small spot) from beneath your cast or splint if there is beneath the cast as with an open reduction.  Redness, swelling, or increasing pain in the wound or from beneath your cast or splint.  Pus coming from wound or from beneath your cast or splint.  An unexplained oral temperature above 102 F (38.9 C) develops, or as your caregiver suggests.  A foul smell coming from the wound or dressing or from beneath your cast or splint.  You have a problem moving any of your fingers. SEEK IMMEDIATE MEDICAL CARE IF:   You develop a rash  You have difficulty breathing  You have any allergy problems If you do not have a window in your cast for observing the wound, a discharge or minor bleeding may show up as a stain on the outside of your cast. Report these findings to your caregiver. MAKE SURE YOU:   Understand these instructions.  Will watch your condition.  Will get help right away if you are not doing well or get worse. Document Released: 11/17/2005 Document Revised: 02/09/2012 Document Reviewed: 07/06/2008 [facility] Patient Information 2015 [place], [place]. This information is not intended to replace advice given to you by your health care provider. Make sure you discuss any questions you have with your health care provider.  Cast or Splint Care Casts and splints support injured limbs and keep bones from moving while they heal. It is important to care for your cast or splint at home.  HOME CARE INSTRUCTIONS  Keep the cast or splint uncovered during the drying period. It can take 24 to 48 hours to dry if it is made of plaster. A fiberglass cast will dry in less than 1 hour.  Do not rest the cast on anything harder than a pillow for the first 24 hours.  Do not put weight on your injured  limb or apply pressure to the cast until your health care provider gives you permission.  Keep the cast or splint dry. Wet casts or splints can lose their shape and may not support the limb as well. A wet cast that has lost its shape can also create harmful pressure on your skin when it dries. Also, wet skin can become infected.  Cover the cast or splint with a plastic bag when bathing or when out in the rain or snow. If the cast is on the trunk of the body, take sponge baths until the cast is removed.  If your cast does become wet, dry it with a towel or a blow dryer on the cool setting only.  Keep your cast or splint clean. Soiled casts may be wiped with a moistened cloth.  Do not place any hard or soft foreign objects under your cast or splint, such as cotton, toilet paper, lotion, or  powder.  Do not try to scratch the skin under the cast with any object. The object could get stuck inside the cast. Also, scratching could lead to an infection. If itching is a problem, use a blow dryer on a cool setting to relieve discomfort.  Do not trim or cut your cast or remove padding from inside of it.  Exercise all joints next to the injury that are not immobilized by the cast or splint. For example, if you have a long leg cast, exercise the hip joint and toes. If you have an arm cast or splint, exercise the shoulder, elbow, thumb, and fingers.  Elevate your injured arm or leg on 1 or 2 pillows for the first 1 to 3 days to decrease swelling and pain.It is best if you can comfortably elevate your cast so it is higher than your heart. SEEK MEDICAL CARE IF:   Your cast or splint cracks.  Your cast or splint is too tight or too loose.  You have unbearable itching inside the cast.  Your cast becomes wet or develops a soft spot or area.  You have a bad smell coming from inside your cast.  You get an object stuck under your cast.  Your skin around the cast becomes red or raw.  You have new pain  or worsening pain after the cast has been applied. SEEK IMMEDIATE MEDICAL CARE IF:   You have fluid leaking through the cast.  You are unable to move your fingers or toes.  You have discolored (blue or white), cool, painful, or very swollen fingers or toes beyond the cast.  You have tingling or numbness around the injured area.  You have severe pain or pressure under the cast.  You have any difficulty with your breathing or have shortness of breath.  You have chest pain. Document Released: 11/14/2000 Document Revised: 09/07/2013 Document Reviewed: 05/26/2013 [facility] Patient Information 2015 [place]. This information is not intended to replace advice given to you by your health care provider. Make sure you discuss any questions you have with your health care provider.

## 2015-02-20 NOTE — ED Provider Notes (Signed)
CSN: [id number]     Arrival date & time 02/20/15  1740 History  This chart was scribed for non-physician practitioner, Allen Derry, PA-C working with Eber Hong, MD by Luisa Dago, Medical Scribe. This patient was seen in room WTR6/WTR6 and the patient's care was started at 6:04 PM.    Chief Complaint  Patient presents with  . Hand Injury   Patient is a 29 y.o. male presenting with hand injury. The history is provided by the patient and medical records. No language interpreter was used.  Hand Injury Location:  Hand Time since incident:  45 minutes Injury: yes   Mechanism of injury: crush   Crush injury:    Mechanism: crushed between wood shelf and ground. Hand location:  R hand Pain details:    Quality:  Throbbing   Radiates to:  Does not radiate   Severity:  Moderate   Onset quality:  Sudden   Duration: 45 minutes.   Timing:  Constant   Progression:  Unchanged Chronicity:  New Handedness:  Right-handed Foreign body present:  No foreign bodies Tetanus status:  Out of date Prior injury to area:  No Relieved by:  Nothing Worsened by:  Movement Ineffective treatments:  None tried Associated symptoms: swelling and tingling (R 4th digit)   Associated symptoms: no decreased range of motion, no fever and no numbness    HPI Comments: Adam Lyons is a 29 y.o. male with PMHx of tobacco use presents to the Emergency Department complaining of right hand injury that occurred approximately 45 minutes PTA. Pt states that his right hand was accidentally crushed by a wood shelf, crushing it between the ground and the wooden shelf. He is also complaining of associated pain and swelling to the dorsum of his right hand.  He rates his pain at rest as a "6/10" and a "10/10" with movement, constant throbbing, nonradiating, worse with movement. Pt denies any taking any OTC medication or applying any cold compresses PTA. Few abrasions noted to the knuckles of the right hand, unknown  when his last tetanus was. He reports some associated paresthesia to the fourth metacarpal/digit, describing it as a tingling but states he can still feel the skin. Pt denies any CP, SOB, abdominal pain, nausea, emesis, urinary symptoms, weakness, or numbness as associated symptoms. Pt takes Clonopin daily for generalized anxiety, no allergies, no other meds. Last meal was yesterday at 11pm.   History reviewed. No pertinent past medical history. Past Surgical History  Procedure Laterality Date  . Tonsillectomy    . Hand surgery Left    History reviewed. No pertinent family history. History  Substance Use Topics  . Smoking status: Current Every Day Smoker  . Smokeless tobacco: Not on file  . Alcohol Use: Yes     Comment: socially     Review of Systems  Constitutional: Negative for fever and chills.  Respiratory: Negative for shortness of breath.   Cardiovascular: Negative for chest pain.  Gastrointestinal: Negative for nausea, vomiting and abdominal pain.  Genitourinary: Negative for dysuria and hematuria.  Musculoskeletal: Positive for joint swelling and arthralgias (R hand).  Skin: Positive for color change and wound (abrasion R hand).  Allergic/Immunologic: Negative for immunocompromised state.  Neurological: Negative for weakness and numbness.       +tingling in R 4th digit  Hematological: Does not bruise/bleed easily.   10 Systems reviewed and are negative for acute change except as noted in the HPI.    Allergies  Review of patient's allergies indicates no  known allergies.  Home Medications   Prior to Admission medications   Medication Sig Start Date End Date Taking? Authorizing Provider  ALPRAZolam Prudy Feeler(XANAX) 0.5 MG tablet Take 0.5 mg by mouth 3 (three) times daily as needed for anxiety.    Historical Provider, MD  amoxicillin (AMOXIL) 500 MG capsule Take 500 mg by mouth every 8 (eight) hours. Until gone.    Historical Provider, MD  busPIRone (BUSPAR) 15 MG tablet Take 15  mg by mouth 3 (three) times daily.    Historical Provider, MD  diazepam (VALIUM) 5 MG tablet Take 1 tablet (5 mg total) by mouth 2 (two) times daily. 01/25/15   Garlon HatchetLisa M Sanders, PA-C  diphenhydrAMINE (BENADRYL) 25 MG tablet Take 1 tablet (25 mg total) by mouth every 6 (six) hours as needed for itching (Rash). 07/21/14   Jennifer Piepenbrink, PA-C  HYDROcodone-acetaminophen (NORCO/VICODIN) 5-325 MG per tablet Take 1 tablet by mouth every 4 (four) hours as needed. 01/25/15   Garlon HatchetLisa M Sanders, PA-C  permethrin (ELIMITE) 5 % cream Apply to affected area once 07/21/14   Victorino DikeJennifer Piepenbrink, PA-C   BP 126/84 mmHg  Pulse 67  Temp(Src) 98.3 F (36.8 C) (Oral)  Resp 18  SpO2 99%   Physical Exam  Constitutional: He is oriented to person, place, and time. Vital signs are normal. He appears well-developed and well-nourished.  Non-toxic appearance. No distress.  Afebrile, nontoxic, NAD  HENT:  Head: Normocephalic and atraumatic.  Mouth/Throat: Mucous membranes are normal.  Eyes: Conjunctivae and EOM are normal. Right eye exhibits no discharge. Left eye exhibits no discharge.  Neck: Normal range of motion. Neck supple.  Cardiovascular: Normal rate and intact distal pulses.   Pulmonary/Chest: Effort normal. No respiratory distress.  Abdominal: Normal appearance. He exhibits no distension.  Musculoskeletal: Normal range of motion.       Right hand: He exhibits tenderness, bony tenderness, deformity, laceration (abrasion to knuckles) and swelling. He exhibits normal range of motion, normal two-point discrimination and normal capillary refill. Normal sensation noted. Decreased strength (due to pain) noted.       Hands: R hand with obvious deformity over 4th metacarpal, with swelling and slight bruising, and TTP. Abrasion across all MCP joints on dorsum, no other skin injury. Cap refill brisk and present. Sensation grossly intact, although some paresthesia in 4th digit, 2 point discrimination remains but pt feels  dullness instead of sharpness. Strength diminished due to pain. Wiggles all digits, able to still flex/extend all DIP/PIP/MCP joints.   Neurological: He is alert and oriented to person, place, and time. He has normal strength. No sensory deficit.  Skin: Skin is warm and dry. Abrasion noted. No rash noted.  Abrasion to R hand knuckles  Psychiatric: He has a normal mood and affect.  Nursing note and vitals reviewed.   ED Course  Procedures (including critical care time)  DIAGNOSTIC STUDIES: Oxygen Saturation is 99% on RA, normal by my interpretation.    COORDINATION OF CARE: 6:11 PM- Pt advised of plan for treatment and pt agrees.  Imaging Review Dg Hand Complete Right  02/20/2015   CLINICAL DATA:  Right hand crushed by heavy shelf  EXAM: RIGHT HAND - COMPLETE 3+ VIEW  COMPARISON:  None.  FINDINGS: There is a fracture through the midshaft of the fourth metacarpal bone. Volar and radial angulation of the distal fracture fragments noted. There is posterior displacement by approximately 1 full shaft's width.  IMPRESSION: Displaced fracture involves the mid shaft of the fourth metacarpal bone.   Electronically  Signed   By: Signa Kell M.D.   On: 02/20/2015 18:00   MDM   Final diagnoses:  Closed fracture of 4th metacarpal, initial encounter  Skin abrasion    29 y.o. male here with R hand injury. Small abrasions to knuckles, will update tetanus. Obvious deformity. Neurovascularly intact with soft compartments although pt has some paresthesia in 4th digit. Xrays obtained which showed 4th metacarpal fx with displacement and angulation. Discussed case with Dr. Hyacinth Meeker who would like to proceed with hematoma block for reduction, and agrees with consultation of hand surgeon. Will give pain meds and reassess after discussing with hand surgeon   7:40 PM Dr. Ophelia Charter requesting that he be placed in ulnar gutter splint and f/up with him. Hematoma block performed by Dr. Hyacinth Meeker, and successful in applying  splint with some attempt to reduce. Post-reduction/splinting exam with neurovascular status intact. Will send home with pain meds, instructed pt to keep hand elevated above heart, with ice 44mins/hr, and f/up with Dr. Ophelia Charter in 2-3 days. Discussed s/sx of compartment syndrome and instructed pt to return immediately if these occur. I explained the diagnosis and have given explicit precautions to return to the ER including for any other new or worsening symptoms. The patient understands and accepts the medical plan as it's been dictated and I have answered their questions. Discharge instructions concerning home care and prescriptions have been given. The patient is STABLE and is discharged to home in good condition.   I personally performed the services described in this documentation, which was scribed in my presence. The recorded information has been reviewed and is accurate.  BP 126/84 mmHg  Pulse 67  Temp(Src) 98.3 F (36.8 C) (Oral)  Resp 18  SpO2 99%  Meds ordered this encounter  Medications  . Tdap (BOOSTRIX) injection 0.5 mL    Sig:   . HYDROcodone-acetaminophen (NORCO/VICODIN) 5-325 MG per tablet 1 tablet    Sig:   . lidocaine (XYLOCAINE) 2 % (with pres) injection 100 mg    Sig:   . HYDROcodone-acetaminophen (NORCO) 5-325 MG per tablet    Sig: Take 1 tablet by mouth every 6 (six) hours as needed for severe pain.    Dispense:  20 tablet    Refill:  0    Order Specific Question:  Supervising Provider    Answer:  MILLER, BRIAN [[id number]]  . naproxen (NAPROSYN) 500 MG tablet    Sig: Take 1 tablet (500 mg total) by mouth 2 (two) times daily as needed for mild pain, moderate pain or headache (TAKE WITH MEALS.).    Dispense:  20 tablet    Refill:  0    Order Specific Question:  Supervising Provider    Answer:  Eber Hong [[id number]]      Chayson Charters Camprubi-Soms, PA-C 02/20/15 1950  Eber Hong, MD 02/21/15 1000

## 2015-02-20 NOTE — ED Notes (Signed)
Pt reports his R hand got crushed by shelf 20 minutes ago. Pt has swelling to posterior R hand.

## 2015-03-15 ENCOUNTER — Emergency Department (HOSPITAL_COMMUNITY): Payer: BLUE CROSS/BLUE SHIELD

## 2015-03-15 ENCOUNTER — Emergency Department (HOSPITAL_COMMUNITY)
Admission: EM | Admit: 2015-03-15 | Discharge: 2015-03-16 | Disposition: A | Discharge status: Home / Self Care | Payer: BLUE CROSS/BLUE SHIELD | Attending: Emergency Medicine | Admitting: Emergency Medicine

## 2015-03-15 ENCOUNTER — Encounter (HOSPITAL_COMMUNITY): Payer: Self-pay

## 2015-03-15 DIAGNOSIS — X838XXA Intentional self-harm by other specified means, initial encounter: Secondary | ICD-10-CM | POA: Insufficient documentation

## 2015-03-15 DIAGNOSIS — Z9119 Patient's noncompliance with other medical treatment and regimen: Secondary | ICD-10-CM | POA: Diagnosis not present

## 2015-03-15 DIAGNOSIS — Y9389 Activity, other specified: Secondary | ICD-10-CM | POA: Insufficient documentation

## 2015-03-15 DIAGNOSIS — Y9289 Other specified places as the place of occurrence of the external cause: Secondary | ICD-10-CM | POA: Diagnosis not present

## 2015-03-15 DIAGNOSIS — Z720 Tobacco use: Secondary | ICD-10-CM | POA: Insufficient documentation

## 2015-03-15 DIAGNOSIS — R45851 Suicidal ideations: Secondary | ICD-10-CM

## 2015-03-15 DIAGNOSIS — Z046 Encounter for general psychiatric examination, requested by authority: Secondary | ICD-10-CM | POA: Diagnosis present

## 2015-03-15 DIAGNOSIS — S0083XA Contusion of other part of head, initial encounter: Secondary | ICD-10-CM | POA: Insufficient documentation

## 2015-03-15 DIAGNOSIS — Y998 Other external cause status: Secondary | ICD-10-CM | POA: Diagnosis not present

## 2015-03-15 DIAGNOSIS — Z79899 Other long term (current) drug therapy: Secondary | ICD-10-CM | POA: Diagnosis not present

## 2015-03-15 DIAGNOSIS — Z8781 Personal history of (healed) traumatic fracture: Secondary | ICD-10-CM | POA: Insufficient documentation

## 2015-03-15 DIAGNOSIS — F332 Major depressive disorder, recurrent severe without psychotic features: Secondary | ICD-10-CM

## 2015-03-15 HISTORY — DX: Post-traumatic stress disorder, unspecified: F43.10

## 2015-03-15 HISTORY — DX: Major depressive disorder, single episode, unspecified: F32.9

## 2015-03-15 HISTORY — DX: Bipolar disorder, unspecified: F31.9

## 2015-03-15 HISTORY — DX: Depression, unspecified: F32.A

## 2015-03-15 HISTORY — DX: Generalized anxiety disorder: F41.1

## 2015-03-15 LAB — CBC
HCT: 51.5 % (ref 39.0–52.0)
Hemoglobin: 17.2 g/dL — ABNORMAL HIGH (ref 13.0–17.0)
MCH: 29.2 pg (ref 26.0–34.0)
MCHC: 33.4 g/dL (ref 30.0–36.0)
MCV: 87.3 fL (ref 78.0–100.0)
Platelets: 214 10*3/uL (ref 150–400)
RBC: 5.9 MIL/uL — AB (ref 4.22–5.81)
RDW: 13.1 % (ref 11.5–15.5)
WBC: 9.7 10*3/uL (ref 4.0–10.5)

## 2015-03-15 LAB — RAPID URINE DRUG SCREEN, HOSP PERFORMED
AMPHETAMINES: NOT DETECTED
BARBITURATES: NOT DETECTED
BENZODIAZEPINES: POSITIVE — AB
COCAINE: NOT DETECTED
Opiates: NOT DETECTED
Tetrahydrocannabinol: POSITIVE — AB

## 2015-03-15 LAB — COMPREHENSIVE METABOLIC PANEL
ALBUMIN: 4.6 g/dL (ref 3.5–5.2)
ALK PHOS: 52 U/L (ref 39–117)
ALT: 18 U/L (ref 0–53)
AST: 16 U/L (ref 0–37)
Anion gap: 10 (ref 5–15)
BILIRUBIN TOTAL: 0.7 mg/dL (ref 0.3–1.2)
BUN: 17 mg/dL (ref 6–23)
CO2: 23 mmol/L (ref 19–32)
Calcium: 9.2 mg/dL (ref 8.4–10.5)
Chloride: 107 mmol/L (ref 96–112)
Creatinine, Ser: 0.95 mg/dL (ref 0.50–1.35)
GFR calc Af Amer: >90 mL/min (ref >90)
GFR calc non Af Amer: >90 mL/min (ref >90)
Glucose, Bld: 107 mg/dL — ABNORMAL HIGH (ref 70–99)
POTASSIUM: 3.9 mmol/L (ref 3.5–5.1)
SODIUM: 140 mmol/L (ref 135–145)
Total Protein: 7.7 g/dL (ref 6.0–8.3)

## 2015-03-15 LAB — ACETAMINOPHEN LEVEL: Acetaminophen (Tylenol), Serum: <10 ug/mL — ABNORMAL LOW (ref 10–30)

## 2015-03-15 LAB — SALICYLATE LEVEL: Salicylate Lvl: <4 mg/dL (ref 2.8–20.0)

## 2015-03-15 LAB — ETHANOL: Alcohol, Ethyl (B): <5 mg/dL (ref 0–9)

## 2015-03-15 MED ORDER — CLONAZEPAM 1 MG PO TABS
1.0000 mg | ORAL_TABLET | Freq: Three times a day (TID) | ORAL | Status: DC | PRN
  Administered 2015-03-15: 1 mg via ORAL
  Filled 2015-03-15: qty 1

## 2015-03-15 MED ORDER — HYDROXYZINE HCL 25 MG PO TABS
50.0000 mg | ORAL_TABLET | Freq: Three times a day (TID) | ORAL | Status: DC | PRN
  Administered 2015-03-15: 50 mg via ORAL
  Filled 2015-03-15: qty 2

## 2015-03-15 MED ORDER — CLONAZEPAM 0.5 MG PO TABS
1.0000 mg | ORAL_TABLET | Freq: Once | ORAL | Status: AC
  Administered 2015-03-15: 1 mg via ORAL
  Filled 2015-03-15: qty 2

## 2015-03-15 MED ORDER — HYDROCODONE-ACETAMINOPHEN 5-325 MG PO TABS
1.0000 | ORAL_TABLET | Freq: Once | ORAL | Status: AC
  Administered 2015-03-15: 1 via ORAL
  Filled 2015-03-15: qty 1

## 2015-03-15 MED ORDER — IBUPROFEN 200 MG PO TABS
600.0000 mg | ORAL_TABLET | Freq: Three times a day (TID) | ORAL | Status: DC | PRN
  Administered 2015-03-15: 600 mg via ORAL
  Filled 2015-03-15: qty 3

## 2015-03-15 MED ORDER — HYDROCODONE-ACETAMINOPHEN 5-325 MG PO TABS
1.0000 | ORAL_TABLET | Freq: Four times a day (QID) | ORAL | Status: DC | PRN
  Administered 2015-03-15 – 2015-03-16 (×2): 1 via ORAL
  Filled 2015-03-15 (×2): qty 1

## 2015-03-15 MED ORDER — NAPROXEN 500 MG PO TABS: 500.0000 mg | ORAL_TABLET | Freq: Two times a day (BID) | ORAL | Status: DC | PRN

## 2015-03-15 MED ORDER — NICOTINE 21 MG/24HR TD PT24
21.0000 mg | MEDICATED_PATCH | Freq: Once | TRANSDERMAL | Status: DC
  Administered 2015-03-15: 21 mg via TRANSDERMAL
  Filled 2015-03-15: qty 1

## 2015-03-15 NOTE — ED Notes (Signed)
Bed: WBH41 Expected date:  Expected time:  Means of arrival:  Comments: Triage 3 

## 2015-03-15 NOTE — ED Notes (Signed)
Pt has been changed into scrubs and wanded by security.  

## 2015-03-15 NOTE — BH Assessment (Signed)
[facility] Assessment Progress Note    Called and scheduled pt's tele assessment with this clinician.  Called EDP Madilyn Hookees at [phone] and gathered clinical information on the pt.    Casimer LaniusKristen Thailan Sava, MS, [facility] Therapeutic Triage Specialist [facility]
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
## 2015-03-15 NOTE — ED Notes (Signed)
Pt AAO x 3, appears sad, family called to check on pt.  Splint to rt hand in place.  Pt appears depressed.  Monitoring for safety, Q 15 min checks in effect.

## 2015-03-15 NOTE — ED Notes (Signed)
Ortho Tech at Bedside.  
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
## 2015-03-15 NOTE — ED Notes (Signed)
Hedda SladeMona Vick ( Mother)  [phone] cell phone/ [phone]. Patient has signed consent to release information to his mother.

## 2015-03-15 NOTE — ED Notes (Addendum)
Pt c/o thoughts of hurting himself w/o a plan starting today.  Sts "I just want the pain to go away.  There has been a chain reaction of things happen, from losing my grandmother to breaking my hand and being out of work for 8 weeks.  Plus, I think, my girlfriend is going to leave me."  Pt reports that "someone" called the authorities and told them that he wanted to harm himself, because he "banged his head off a table this afternoon."  Sts "everything just came to a head."  Denies SI/HI/AV.  Sts "I don't want to die."    Pt reports that he sees a psychiatrist and is only compliant w/ some presribed medications.  Pt recently had a plate and screws placed in R hand.  Pain score 9/10.

## 2015-03-16 DIAGNOSIS — R45851 Suicidal ideations: Secondary | ICD-10-CM | POA: Insufficient documentation

## 2015-03-16 DIAGNOSIS — F332 Major depressive disorder, recurrent severe without psychotic features: Secondary | ICD-10-CM

## 2015-03-16 MED ORDER — GABAPENTIN 300 MG PO CAPS: 300.0000 mg | ORAL_CAPSULE | Freq: Every day | ORAL | Status: DC

## 2015-03-16 NOTE — Progress Notes (Signed)
Per psychiatrist and NP, patient psychiatrically stable for discharge home. Pt plans to [facility] with [facility] and will make his follow up appointment.   Olga CoasterKristen Ekin Pilar, LCSW  Clinical Social Work  [facility] [phone]

## 2015-03-16 NOTE — [facility] Suicide Risk Assessment (Cosign Needed)
Suicide Risk Assessment  Discharge Assessment   [facility]   Demographic Factors:  Male, Adolescent or young adult and Caucasian  Total Time spent with patient: 20 minutes  Musculoskeletal: Strength & Muscle Tone: within normal limits Gait & Station: normal Patient leans: N/A  Psychiatric Specialty Exam:     Blood pressure 125/81, pulse 60, temperature 97.5 F (36.4 C), temperature source Oral, resp. rate 16, SpO2 97 %.There is no weight on file to calculate BMI.  General Appearance: Casual and Fairly Groomed  Eye Contact::  Good  Speech:  Clear and Coherent and Normal Rate409  Volume:  Normal  Mood:  Anxious and Depressed  Affect:  Congruent and Depressed  Thought Process:  Coherent, Goal Directed and Intact  Orientation:  Full (Time, Place, and Person)  Thought Content:  WDL  Suicidal Thoughts:  No  Homicidal Thoughts:  No  Memory:  Immediate;   Good Recent;   Good Remote;   Good  Judgement:  Good  Insight:  Good  Psychomotor Activity:  Normal  Concentration:  Good  Recall:  NA  Fund of Knowledge:Good  Language: Good  Akathisia:  NA  Handed:  Right  AIMS (if indicated):     Assets:  Desire for Improvement  Sleep:     Cognition: WNL  ADL's:  Intact      Has this patient used any form of tobacco in the last 30 days? (Cigarettes, Smokeless Tobacco, Cigars, and/or Pipes) Yes, A prescription for an FDA-approved tobacco cessation medication was offered at discharge and the patient refused  Mental Status Per Nursing Assessment::   On Admission:     Current Mental Status by Physician: NA  Loss Factors: NA  Historical Factors: NA  Risk Reduction Factors:   Religious beliefs about death, Employed, Living with another person, especially a relative and Positive therapeutic relationship  Continued Clinical Symptoms:  Bipolar Disorder:   Depressive phase Depression:   Insomnia  Cognitive Features That Contribute To Risk:   Polarized thinking    Suicide Risk:  Minimal: No identifiable suicidal ideation.  Patients presenting with no risk factors but with morbid ruminations; may be classified as minimal risk based on the severity of the depressive symptoms  Principal Problem: Major depressive disorder, recurrent severe without psychotic features Discharge Diagnoses:  Patient Active Problem List   Diagnosis Date Noted  . Major depressive disorder, recurrent severe without psychotic features [F33.2] 03/16/2015    Priority: High  . Suicidal thoughts [R45.851]     Follow-up Information    Schedule an appointment as soon as possible for a visit to follow up.   Contact information:   [facility]  [address],  [place], [place] [address] [phone]      Plan Of Care/Follow-up recommendations:  Activity:  as tolerated Diet:  regular  Is patient on multiple antipsychotic therapies at discharge:  No   Has Patient had three or more failed trials of antipsychotic monotherapy by history:  No  Recommended Plan for Multiple Antipsychotic Therapies: NA    Dahlia ByesONUOHA, Ulis Kaps C   PMHNP-BC 03/16/2015, 12:39 PM

## 2015-03-16 NOTE — Consult Note (Signed)
[facility] Consult   Reason for Consult:  MAJOR DEPRESSION, ANXIETY DISORDER Referring Physician:  EDP Patient Identification: Bralin Garry MRN:  [id number] Principal Diagnosis: Major depressive disorder, recurrent severe without psychotic features Diagnosis:   Patient Active Problem List   Diagnosis Date Noted  . Major depressive disorder, recurrent severe without psychotic features [F33.2] 03/16/2015    Priority: High    Total Time spent with patient: 1 hour  Subjective:   Vere Diantonio is a 29 y.o. male patient admitted with Major depressor, Anxiety disorder.    HPI: Mr. Juhasz, a caucasian male, 29 years old  Was evaluated with his wife sitting next to him.  He came in yesterday for increased feelings of depression after an argument with her and stated that he wanted to end his life.. Patient states that he's been under a lot of stress recently especially financial difficulty after injuring his right arm and getting paid half of what he used to make.  Marland Kitchen He recently had surgery on his right hand for a broken metacarpal and is being paid half of his  Usual pay.  Marland Kitchen He also got into an argument with his fiance and felt she was about to leave him.  Patient sees Dr Darleene Cleaver in his Clinic for depression and anxiety and stated that he has been compliant with his medications.  He reported getting overwhelmed with the death of her grandmother whose ash is being brought home by patient's mother.  Today he felt calmer with his GF sitting next to him.  He denied SI/HI/AVH.  He reports that his inpatient Psychiatric hospitalization was 17 years ago.  Patient will be discharged home and will follow up with Dr Darleene Cleaver.  HPI Elements:   Location:  Major depressive disorder, Anxiety disorder. Quality:  severe. Severity:  severe. Timing:  Acute. Duration:  Chronic Mental illness. Context:  Seeking treatment for stress and increased feelings of depression, anxiety..  Past Medical  History:  Past Medical History  Diagnosis Date  . PTSD (post-traumatic stress disorder)   . Generalized anxiety disorder   . Chronic depression   . Bipolar disorder     Past Surgical History  Procedure Laterality Date  . Tonsillectomy    . Hand surgery Left    Family History: History reviewed. No pertinent family history. Social History:  History  Alcohol Use  . Yes    Comment: socially      History  Drug Use  . Yes  . Special: Marijuana    History   Social History  . Marital Status: Single    Spouse Name: N/A  . Number of Children: N/A  . Years of Education: N/A   Social History Main Topics  . Smoking status: Current Every Day Smoker -- 3.00 packs/day    Types: Cigarettes  . Smokeless tobacco: Not on file  . Alcohol Use: Yes     Comment: socially   . Drug Use: Yes    Special: Marijuana  . Sexual Activity: Not on file   Other Topics Concern  . None   Social History Narrative   Additional Social History:    Pain Medications: see med list Prescriptions: see med list Over the Counter: see med list History of alcohol / drug use?: Yes Longest period of sobriety (when/how long): unknown Negative Consequences of Use:  (na) Withdrawal Symptoms:  (na) Name of Substance 1: Alcohol 1 - Age of First Use: unsure 1 - Amount (size/oz): 2-4 shots 1 - Frequency: once per  week 1 - Duration: ongoing 1 - Last Use / Amount: last night 0 4 shots l Name of Substance 2: Marijuana 2 - Age of First Use: 10 2 - Amount (size/oz): 1 bowl 2 - Frequency: once per week 2 - Duration: ongoing 2 - Last Use / Amount: this week - one bowl                 Allergies:  No Known Allergies  Labs:  Results for orders placed or performed during the hospital encounter of 03/15/15 (from the past 48 hour(s))  Urine Drug Screen     Status: Abnormal   Collection Time: 03/15/15  3:28 PM  Result Value Ref Range   Opiates NONE DETECTED NONE DETECTED   Cocaine NONE DETECTED NONE  DETECTED   Benzodiazepines POSITIVE (A) NONE DETECTED   Amphetamines NONE DETECTED NONE DETECTED   Tetrahydrocannabinol POSITIVE (A) NONE DETECTED   Barbiturates NONE DETECTED NONE DETECTED    Comment:        DRUG SCREEN FOR MEDICAL PURPOSES ONLY.  IF CONFIRMATION IS NEEDED FOR ANY PURPOSE, NOTIFY LAB WITHIN 5 DAYS.        LOWEST DETECTABLE LIMITS FOR URINE DRUG SCREEN Drug Class       Cutoff (ng/mL) Amphetamine      1000 Barbiturate      200 Benzodiazepine   354 Tricyclics       656 Opiates          300 Cocaine          300 THC              50   Acetaminophen level     Status: Abnormal   Collection Time: 03/15/15  4:07 PM  Result Value Ref Range   Acetaminophen (Tylenol), Serum <10.0 (L) 10 - 30 ug/mL    Comment:        THERAPEUTIC CONCENTRATIONS VARY SIGNIFICANTLY. A RANGE OF 10-30 ug/mL MAY BE AN EFFECTIVE CONCENTRATION FOR MANY PATIENTS. HOWEVER, SOME ARE BEST TREATED AT CONCENTRATIONS OUTSIDE THIS RANGE. ACETAMINOPHEN CONCENTRATIONS >150 ug/mL AT 4 HOURS AFTER INGESTION AND >50 ug/mL AT 12 HOURS AFTER INGESTION ARE OFTEN ASSOCIATED WITH TOXIC REACTIONS.   CBC     Status: Abnormal   Collection Time: 03/15/15  4:07 PM  Result Value Ref Range   WBC 9.7 4.0 - 10.5 K/uL   RBC 5.90 (H) 4.22 - 5.81 MIL/uL   Hemoglobin 17.2 (H) 13.0 - 17.0 g/dL   HCT 51.5 39.0 - 52.0 %   MCV 87.3 78.0 - 100.0 fL   MCH 29.2 26.0 - 34.0 pg   MCHC 33.4 30.0 - 36.0 g/dL   RDW 13.1 11.5 - 15.5 %   Platelets 214 150 - 400 K/uL  Comprehensive metabolic panel     Status: Abnormal   Collection Time: 03/15/15  4:07 PM  Result Value Ref Range   Sodium 140 135 - 145 mmol/L   Potassium 3.9 3.5 - 5.1 mmol/L   Chloride 107 96 - 112 mmol/L   CO2 23 19 - 32 mmol/L   Glucose, Bld 107 (H) 70 - 99 mg/dL   BUN 17 6 - 23 mg/dL   Creatinine, Ser 0.95 0.50 - 1.35 mg/dL   Calcium 9.2 8.4 - 10.5 mg/dL   Total Protein 7.7 6.0 - 8.3 g/dL   Albumin 4.6 3.5 - 5.2 g/dL   AST 16 0 - 37 U/L   ALT 18 0  - 53 U/L   Alkaline Phosphatase 52  39 - 117 U/L   Total Bilirubin 0.7 0.3 - 1.2 mg/dL   GFR calc non Af Amer >90 >90 mL/min   GFR calc Af Amer >90 >90 mL/min    Comment: (NOTE) The eGFR has been calculated using the CKD EPI equation. This calculation has not been validated in all clinical situations. eGFR's persistently <90 mL/min signify possible Chronic Kidney Disease.    Anion gap 10 5 - 15  Ethanol (ETOH)     Status: None   Collection Time: 03/15/15  4:07 PM  Result Value Ref Range   Alcohol, Ethyl (B) <5 0 - 9 mg/dL    Comment:        LOWEST DETECTABLE LIMIT FOR SERUM ALCOHOL IS 11 mg/dL FOR MEDICAL PURPOSES ONLY   Salicylate level     Status: None   Collection Time: 03/15/15  4:07 PM  Result Value Ref Range   Salicylate Lvl <7.0 2.8 - 20.0 mg/dL    Vitals: Blood pressure 125/81, pulse 60, temperature 97.5 F (36.4 C), temperature source Oral, resp. rate 16, SpO2 97 %.  Risk to Self: Suicidal Ideation: Yes-Currently Present Suicidal Intent: Yes-Currently Present Is patient at risk for suicide?: Yes Suicidal Plan?: Yes-Currently Present Specify Current Suicidal Plan: had knife to his neck and was on railroad tracks Access to [employer]: Yes Specify Access to Suicidal Means: sharps and can get to railroad tracks What has been your use of drugs/alcohol within the last 12 months?: pt reports alcohol and marijuana use weekly How many times?: 0 (age 74 had SI) Other Self Harm Risks: na - pt denies Triggers for Past Attempts: None known Intentional Self Injurious Behavior: None Risk to Others: Homicidal Ideation: No Thoughts of Harm to Others: No Current Homicidal Intent: No Current Homicidal Plan: No Access to Homicidal Means: No Identified Victim: na - pt denies History of harm to others?: No Assessment of Violence: None Noted Violent Behavior Description: na - pt cooperative Does patient have access to weapons?: No Criminal Charges Pending?: No Does patient have a  court date: No Prior Inpatient Therapy: Prior Inpatient Therapy: Yes Prior Therapy Dates: 1996 Prior Therapy Facilty/Provider(s): [facility] Reason for Treatment: SI Prior Outpatient Therapy: Prior Outpatient Therapy: Yes Prior Therapy Dates: 2012-current Prior Therapy Facilty/Provider(s): Dr. Candis Schatz and currently Dr. Darleene Cleaver Reason for Treatment: Med mgnt  Current Facility-Administered Medications  Medication Dose Route Frequency Provider Last Rate Last Dose  . clonazePAM (KLONOPIN) tablet 1 mg  1 mg Oral TID PRN Quintella Reichert, MD   1 mg at 03/15/15 2107  . gabapentin (NEURONTIN) capsule 300 mg  300 mg Oral QHS Loda Bialas      . HYDROcodone-acetaminophen (NORCO/VICODIN) 5-325 MG per tablet 1 tablet  1 tablet Oral Q6H PRN Quintella Reichert, MD   1 tablet at 03/16/15 1122  . hydrOXYzine (ATARAX/VISTARIL) tablet 50 mg  50 mg Oral TID PRN Delfin Gant, NP   50 mg at 03/15/15 2107  . ibuprofen (ADVIL,MOTRIN) tablet 600 mg  600 mg Oral Q8H PRN Delfin Gant, NP   600 mg at 03/15/15 2107  . naproxen (NAPROSYN) tablet 500 mg  500 mg Oral BID PRN Quintella Reichert, MD      . nicotine (NICODERM CQ - dosed in mg/24 hours) patch 21 mg  21 mg Transdermal Once Quintella Reichert, MD   21 mg at 03/15/15 2107   Current Outpatient Prescriptions  Medication Sig Dispense Refill  . clonazePAM (KLONOPIN) 1 MG tablet Take 1 mg by mouth 3 (three) times daily  as needed for anxiety (anxiety).    Marland Kitchen HYDROcodone-acetaminophen (NORCO) 5-325 MG per tablet Take 1 tablet by mouth every 6 (six) hours as needed for severe pain. 20 tablet 0  . HYDROcodone-acetaminophen (NORCO) 7.5-325 MG per tablet Take 1 tablet by mouth every 6 (six) hours as needed for moderate pain or severe pain (pain).    . naproxen (NAPROSYN) 500 MG tablet Take 1 tablet (500 mg total) by mouth 2 (two) times daily as needed for mild pain, moderate pain or headache (TAKE WITH MEALS.). 20 tablet 0  . traMADol (ULTRAM) 50 MG tablet Take 50 mg by  mouth every 6 (six) hours as needed for moderate pain or severe pain (pain).    . diazepam (VALIUM) 5 MG tablet Take 1 tablet (5 mg total) by mouth 2 (two) times daily. (Patient not taking: Reported on 03/15/2015) 10 tablet 0  . diphenhydrAMINE (BENADRYL) 25 MG tablet Take 1 tablet (25 mg total) by mouth every 6 (six) hours as needed for itching (Rash). (Patient not taking: Reported on 03/15/2015) 30 tablet 0  . HYDROcodone-acetaminophen (NORCO/VICODIN) 5-325 MG per tablet Take 1 tablet by mouth every 4 (four) hours as needed. (Patient not taking: Reported on 03/15/2015) 12 tablet 0  . permethrin (ELIMITE) 5 % cream Apply to affected area once 60 g 0    Musculoskeletal: Strength & Muscle Tone: within normal limits Gait & Station: normal Patient leans: N/A  Psychiatric Specialty Exam:     Blood pressure 125/81, pulse 60, temperature 97.5 F (36.4 C), temperature source Oral, resp. rate 16, SpO2 97 %.There is no weight on file to calculate BMI.  General Appearance: Casual and Fairly Groomed  Eye Contact::  Good  Speech:  Clear and Coherent and Normal Rate  Volume:  Normal  Mood:  Anxious and Depressed  Affect:  Congruent and Depressed  Thought Process:  Coherent, Goal Directed and Intact  Orientation:  Full (Time, Place, and Person)  Thought Content:  WDL  Suicidal Thoughts:  No  Homicidal Thoughts:  No  Memory:  Immediate;   Good Recent;   Good Remote;   Good  Judgement:  Good  Insight:  Good  Psychomotor Activity:  Normal  Concentration:  Good  Recall:  NA  Fund of Knowledge:Good  Language: NA  Akathisia:  NA  Handed:  Right  AIMS (if indicated):     Assets:  Desire for Improvement  ADL's:  Intact  Cognition: WNL  Sleep:      Medical Decision Making: Established Problem, Stable/Improving (1)  Treatment Plan Summary: discharge home  Plan:  discharge home Disposition: see above  Delfin Gant   PMHNP-BC 03/16/2015 12:18 PM Patient seen face-to-face for  psychiatric evaluation, chart reviewed and case discussed with the physician extender and developed treatment plan. Reviewed the information documented and agree with the treatment plan. Corena Pilgrim, MD

## 2015-04-09 ENCOUNTER — Other Ambulatory Visit (HOSPITAL_COMMUNITY): Payer: Self-pay | Admitting: Psychiatry

## 2015-04-09 ENCOUNTER — Encounter (HOSPITAL_COMMUNITY): Payer: Self-pay | Admitting: Emergency Medicine

## 2015-04-09 ENCOUNTER — Inpatient Hospital Stay (HOSPITAL_COMMUNITY)
Admission: EM | Admit: 2015-04-09 | Discharge: 2015-04-11 | DRG: 917 | Disposition: A | Discharge status: Other Acute Inpatient Hospital | Payer: BLUE CROSS/BLUE SHIELD | Attending: Internal Medicine | Admitting: Internal Medicine

## 2015-04-09 DIAGNOSIS — T426X2A Poisoning by other antiepileptic and sedative-hypnotic drugs, intentional self-harm, initial encounter: Secondary | ICD-10-CM | POA: Diagnosis present

## 2015-04-09 DIAGNOSIS — S6291XG Unspecified fracture of right wrist and hand, subsequent encounter for fracture with delayed healing: Secondary | ICD-10-CM | POA: Diagnosis not present

## 2015-04-09 DIAGNOSIS — F319 Bipolar disorder, unspecified: Secondary | ICD-10-CM | POA: Diagnosis not present

## 2015-04-09 DIAGNOSIS — T424X2A Poisoning by benzodiazepines, intentional self-harm, initial encounter: Secondary | ICD-10-CM | POA: Diagnosis present

## 2015-04-09 DIAGNOSIS — F17210 Nicotine dependence, cigarettes, uncomplicated: Secondary | ICD-10-CM | POA: Diagnosis present

## 2015-04-09 DIAGNOSIS — G9340 Encephalopathy, unspecified: Secondary | ICD-10-CM | POA: Diagnosis present

## 2015-04-09 DIAGNOSIS — Z720 Tobacco use: Secondary | ICD-10-CM

## 2015-04-09 DIAGNOSIS — Z8249 Family history of ischemic heart disease and other diseases of the circulatory system: Secondary | ICD-10-CM

## 2015-04-09 DIAGNOSIS — F411 Generalized anxiety disorder: Secondary | ICD-10-CM | POA: Diagnosis present

## 2015-04-09 DIAGNOSIS — F4310 Post-traumatic stress disorder, unspecified: Secondary | ICD-10-CM | POA: Diagnosis present

## 2015-04-09 DIAGNOSIS — F332 Major depressive disorder, recurrent severe without psychotic features: Secondary | ICD-10-CM | POA: Diagnosis present

## 2015-04-09 DIAGNOSIS — T50904A Poisoning by unspecified drugs, medicaments and biological substances, undetermined, initial encounter: Secondary | ICD-10-CM | POA: Diagnosis present

## 2015-04-09 DIAGNOSIS — S6291XA Unspecified fracture of right wrist and hand, initial encounter for closed fracture: Secondary | ICD-10-CM | POA: Diagnosis present

## 2015-04-09 DIAGNOSIS — T50901A Poisoning by unspecified drugs, medicaments and biological substances, accidental (unintentional), initial encounter: Secondary | ICD-10-CM | POA: Diagnosis present

## 2015-04-09 DIAGNOSIS — F1220 Cannabis dependence, uncomplicated: Secondary | ICD-10-CM | POA: Diagnosis not present

## 2015-04-09 DIAGNOSIS — T50902A Poisoning by unspecified drugs, medicaments and biological substances, intentional self-harm, initial encounter: Secondary | ICD-10-CM | POA: Diagnosis not present

## 2015-04-09 DIAGNOSIS — F17200 Nicotine dependence, unspecified, uncomplicated: Secondary | ICD-10-CM | POA: Diagnosis present

## 2015-04-09 DIAGNOSIS — T1491 Suicide attempt: Secondary | ICD-10-CM

## 2015-04-09 DIAGNOSIS — G92 Toxic encephalopathy: Secondary | ICD-10-CM | POA: Diagnosis present

## 2015-04-09 DIAGNOSIS — T50902S Poisoning by unspecified drugs, medicaments and biological substances, intentional self-harm, sequela: Secondary | ICD-10-CM | POA: Diagnosis not present

## 2015-04-09 DIAGNOSIS — T1491XA Suicide attempt, initial encounter: Secondary | ICD-10-CM | POA: Diagnosis present

## 2015-04-09 LAB — CBC
HCT: 48.9 % (ref 39.0–52.0)
HEMOGLOBIN: 16.3 g/dL (ref 13.0–17.0)
MCH: 29.2 pg (ref 26.0–34.0)
MCHC: 33.3 g/dL (ref 30.0–36.0)
MCV: 87.6 fL (ref 78.0–100.0)
PLATELETS: 193 10*3/uL (ref 150–400)
RBC: 5.58 MIL/uL (ref 4.22–5.81)
RDW: 13.1 % (ref 11.5–15.5)
WBC: 10.2 10*3/uL (ref 4.0–10.5)

## 2015-04-09 LAB — COMPREHENSIVE METABOLIC PANEL
ALBUMIN: 4.1 g/dL (ref 3.5–5.0)
ALT: 17 U/L (ref 17–63)
AST: 16 U/L (ref 15–41)
Alkaline Phosphatase: 42 U/L (ref 38–126)
Anion gap: 8 (ref 5–15)
BUN: 18 mg/dL (ref 6–20)
CALCIUM: 9 mg/dL (ref 8.9–10.3)
CO2: 24 mmol/L (ref 22–32)
Chloride: 110 mmol/L (ref 101–111)
Creatinine, Ser: 0.93 mg/dL (ref 0.61–1.24)
GFR calc Af Amer: >60 mL/min (ref >60)
GFR calc non Af Amer: >60 mL/min (ref >60)
Glucose, Bld: 101 mg/dL — ABNORMAL HIGH (ref 70–99)
Potassium: 3.8 mmol/L (ref 3.5–5.1)
SODIUM: 142 mmol/L (ref 135–145)
TOTAL PROTEIN: 6.9 g/dL (ref 6.5–8.1)
Total Bilirubin: 1.1 mg/dL (ref 0.3–1.2)

## 2015-04-09 LAB — RAPID URINE DRUG SCREEN, HOSP PERFORMED
Amphetamines: NOT DETECTED
BARBITURATES: NOT DETECTED
BENZODIAZEPINES: POSITIVE — AB
COCAINE: NOT DETECTED
OPIATES: POSITIVE — AB
TETRAHYDROCANNABINOL: POSITIVE — AB

## 2015-04-09 LAB — ETHANOL: Alcohol, Ethyl (B): <5 mg/dL (ref <5)

## 2015-04-09 LAB — MRSA PCR SCREENING: MRSA by PCR: NEGATIVE

## 2015-04-09 LAB — SALICYLATE LEVEL: Salicylate Lvl: <4 mg/dL (ref 2.8–30.0)

## 2015-04-09 LAB — ACETAMINOPHEN LEVEL: Acetaminophen (Tylenol), Serum: <10 ug/mL — ABNORMAL LOW (ref 10–30)

## 2015-04-09 LAB — MAGNESIUM: Magnesium: 2.1 mg/dL (ref 1.7–2.4)

## 2015-04-09 MED ORDER — IBUPROFEN 200 MG PO TABS: 600.0000 mg | ORAL_TABLET | Freq: Four times a day (QID) | ORAL | Status: DC | PRN

## 2015-04-09 MED ORDER — DIAZEPAM 5 MG/ML IJ SOLN: 5.0000 mg | INTRAMUSCULAR | Status: DC | PRN

## 2015-04-09 MED ORDER — SODIUM CHLORIDE 0.9 % IV SOLN
INTRAVENOUS | Status: DC
  Administered 2015-04-09: 22:00:00 via INTRAVENOUS

## 2015-04-09 MED ORDER — ONDANSETRON HCL 4 MG/2ML IJ SOLN: 4.0000 mg | Freq: Four times a day (QID) | INTRAMUSCULAR | Status: DC | PRN

## 2015-04-09 MED ORDER — ACETAMINOPHEN 325 MG PO TABS: 650.0000 mg | ORAL_TABLET | Freq: Four times a day (QID) | ORAL | Status: DC | PRN

## 2015-04-09 MED ORDER — HEPARIN SODIUM (PORCINE) 5000 UNIT/ML IJ SOLN
5000.0000 [IU] | Freq: Three times a day (TID) | INTRAMUSCULAR | Status: DC
  Administered 2015-04-09 – 2015-04-10 (×2): 5000 [IU] via SUBCUTANEOUS
  Filled 2015-04-09 (×2): qty 1

## 2015-04-09 MED ORDER — SODIUM CHLORIDE 0.9 % IJ SOLN
3.0000 mL | Freq: Two times a day (BID) | INTRAMUSCULAR | Status: DC
  Administered 2015-04-09 – 2015-04-11 (×2): 3 mL via INTRAVENOUS

## 2015-04-09 MED ORDER — ACETAMINOPHEN 650 MG RE SUPP: 650.0000 mg | Freq: Four times a day (QID) | RECTAL | Status: DC | PRN

## 2015-04-09 MED ORDER — NICOTINE 21 MG/24HR TD PT24
21.0000 mg | MEDICATED_PATCH | Freq: Every day | TRANSDERMAL | Status: DC
  Administered 2015-04-09 – 2015-04-11 (×3): 21 mg via TRANSDERMAL
  Filled 2015-04-09 (×3): qty 1

## 2015-04-09 MED ORDER — ONDANSETRON HCL 4 MG PO TABS: 4.0000 mg | ORAL_TABLET | Freq: Four times a day (QID) | ORAL | Status: DC | PRN

## 2015-04-09 NOTE — ED Notes (Signed)
Pt's mother called wanting to know update status from when he came in.  Informed her pt is stable and we are currently monitoring him at this time.  She is on her way from [place].

## 2015-04-09 NOTE — ED Notes (Signed)
Poison Control called to follow up on pt. No further recommendations at this time.

## 2015-04-09 NOTE — ED Notes (Signed)
hospitalist MD at bedside. 

## 2015-04-09 NOTE — ED Provider Notes (Signed)
CSN: [id number]     Arrival date & time 04/09/15  1410 History   First MD Initiated Contact with Patient 04/09/15 1459     Chief Complaint  Patient presents with  . Drug Overdose     (Consider location/radiation/quality/duration/timing/severity/associated sxs/prior Treatment) HPI Comments: Patient here after intentional ingestion of Klonopin and gabapentin approximately 2 hours prior to arrival. Patient is somnolent but arousable in the history is per EMS. Patient stated that he has been more depressed as well as frustrated. Intentional ingestion due to recent losses. EMS called and patient presents at this time. No further history obtainable  Patient is a 29 y.o. male presenting with Overdose. The history is provided by the patient and the EMS personnel. The history is limited by the condition of the patient.  Drug Overdose    Past Medical History  Diagnosis Date  . PTSD (post-traumatic stress disorder)   . Generalized anxiety disorder   . Chronic depression   . Bipolar disorder    Past Surgical History  Procedure Laterality Date  . Tonsillectomy    . Hand surgery Left    No family history on file. History  Substance Use Topics  . Smoking status: Current Every Day Smoker -- 3.00 packs/day    Types: Cigarettes  . Smokeless tobacco: Not on file  . Alcohol Use: Yes     Comment: socially     Review of Systems  Unable to perform ROS     Allergies  Review of patient's allergies indicates no known allergies.  Home Medications   Prior to Admission medications   Medication Sig Start Date End Date Taking? Authorizing Provider  clonazePAM (KLONOPIN) 1 MG tablet Take 1 mg by mouth 3 (three) times daily as needed for anxiety (anxiety).   Yes Historical Provider, MD  gabapentin (NEURONTIN) 300 MG capsule Take 1 capsule (300 mg total) by mouth at bedtime. 03/16/15  Yes Earney NavyJosephine C Onuoha, NP  HYDROcodone-acetaminophen (NORCO) 5-325 MG per tablet Take 1 tablet by mouth every 6  (six) hours as needed for severe pain. 02/20/15   Mercedes Camprubi-Soms, PA-C  naproxen (NAPROSYN) 500 MG tablet Take 1 tablet (500 mg total) by mouth 2 (two) times daily as needed for mild pain, moderate pain or headache (TAKE WITH MEALS.). 02/20/15   Mercedes Camprubi-Soms, PA-C  permethrin (ELIMITE) 5 % cream Apply to affected area once 07/21/14   Victorino DikeJennifer Piepenbrink, PA-C   BP 104/67 mmHg  Pulse 78  Resp 19  SpO2 95% Physical Exam  Constitutional: He appears well-developed and well-nourished. He appears lethargic.  Non-toxic appearance. No distress.  HENT:  Head: Normocephalic and atraumatic.  Eyes: Conjunctivae, EOM and lids are normal. Pupils are equal, round, and reactive to light.  Neck: Normal range of motion. Neck supple. No tracheal deviation present. No thyroid mass present.  Cardiovascular: Normal rate, regular rhythm and normal heart sounds.  Exam reveals no gallop.   No murmur heard. Pulmonary/Chest: Effort normal and breath sounds normal. No stridor. No respiratory distress. He has no decreased breath sounds. He has no wheezes. He has no rhonchi. He has no rales.  Abdominal: Soft. Normal appearance and bowel sounds are normal. He exhibits no distension. There is no tenderness. There is no rebound and no CVA tenderness.  Musculoskeletal: Normal range of motion. He exhibits no edema or tenderness.  Neurological: He appears lethargic. GCS eye subscore is 3. GCS verbal subscore is 4. GCS motor subscore is 5.  Skin: Skin is warm and dry. No abrasion and  no rash noted.  Nursing note and vitals reviewed.   ED Course  Procedures (including critical care time) Labs Review Labs Reviewed  CBC  ACETAMINOPHEN LEVEL  COMPREHENSIVE METABOLIC PANEL  ETHANOL  SALICYLATE LEVEL  URINE RAPID DRUG SCREEN (HOSP PERFORMED)    Imaging Review No results found.   EKG Interpretation   Date/Time:  Monday Apr 09 2015 14:26:51 EDT Ventricular Rate:  85 PR Interval:  137 QRS Duration:  98 QT Interval:  356 QTC Calculation: 423 R Axis:   57 Text Interpretation:  Sinus rhythm Borderline T wave abnormalities  Baseline wander in lead(s) V2 Confirmed by Andres Escandon  MD, Demarrio Menges ([id number]) on  04/09/2015 3:31:51 PM      MDM   Final diagnoses:  None    Patient is protecting his airway at this time. He is arousable to some relation. He was assessed multiple times and after 4 hours observation remains somnolent but arousable. He will require admission to stepdown for further observation  CRITICAL CARE Performed by: Toy BakerALLEN,Ryo Klang T Total critical care time: 60 Critical care time was exclusive of separately billable procedures and treating other patients. Critical care was necessary to treat or prevent imminent or life-threatening deterioration. Critical care was time spent personally by me on the following activities: development of treatment plan with patient and/or surrogate as well as nursing, discussions with consultants, evaluation of patient's response to treatment, examination of patient, obtaining history from patient or surrogate, ordering and performing treatments and interventions, ordering and review of laboratory studies, ordering and review of radiographic studies, pulse oximetry and re-evaluation of patient's condition.     Lorre NickAnthony Nuh Lipton, MD 04/09/15 [phone]

## 2015-04-09 NOTE — H&P (Signed)
[facility] History and Physical  Adam Lyons Kaiser RUE:[id number] DOB: [birth year] DOA: 04/09/2015  Referring physician: Dr Freida BusmanAllen - [facility] PCP: No PCP Per Patient   Chief Complaint: Overdose and AMS  HPI: Adam Lyons Shurley is a 29 y.o. male  Level V caveat: Patient presenting in an altered mental state and unable to participate in the admission process.   At approximately 13:00 today patient took an unknown quantity of Neurontin and Klonopin. This was  intentionally done as an attempt at suicide. Per ED the report patient has had increasing stress and frustration recently it has become increasingly more depressed. Per patient's girlfriend who is called over the phone this is the patient's 3rd attempt in the past month. Pt has not followed up after leaving the hospital.  He was without any physical complaint prior to presenting to the emergency department.  Review of Systems:   Unable to obtain further review of systems as patient is presenting with altered mental status.   Past Medical History  Diagnosis Date  . PTSD (post-traumatic stress disorder)   . Generalized anxiety disorder   . Chronic depression   . Bipolar disorder    Past Surgical History  Procedure Laterality Date  . Tonsillectomy    . Hand surgery Left    Social History:  reports that he has been smoking Cigarettes.  He has been smoking about 3.00 packs per day. He does not have any smokeless tobacco history on file. He reports that he drinks alcohol. He reports that he uses illicit drugs (Marijuana).  No Known Allergies  Family History  Problem Relation Age of Onset  . Family history unknown: Yes     Prior to Admission medications   Medication Sig Start Date End Date Taking? Authorizing Provider  clonazePAM (KLONOPIN) 1 MG tablet Take 1 mg by mouth 3 (three) times daily as needed for anxiety (anxiety).   Yes Historical Provider, MD  gabapentin (NEURONTIN) 300 MG capsule Take 1 capsule (300 mg total) by mouth at  bedtime. 03/16/15  Yes Earney NavyJosephine C Onuoha, NP  HYDROcodone-acetaminophen (NORCO) 5-325 MG per tablet Take 1 tablet by mouth every 6 (six) hours as needed for severe pain. 02/20/15   Mercedes Camprubi-Soms, PA-C  naproxen (NAPROSYN) 500 MG tablet Take 1 tablet (500 mg total) by mouth 2 (two) times daily as needed for mild pain, moderate pain or headache (TAKE WITH MEALS.). 02/20/15   Mercedes Camprubi-Soms, PA-C  permethrin (ELIMITE) 5 % cream Apply to affected area once 07/21/14   Francee PiccoloJennifer Piepenbrink, PA-C   Physical Exam: Filed Vitals:   04/09/15 1837 04/09/15 1845 04/09/15 1900 04/09/15 1915  BP:  108/65 116/58 107/68  Pulse:  55 63 58  Resp:  16 17 17   SpO2: 97% 96% 96% 97%    Wt Readings from Last 3 Encounters:  08/13/13 113.399 kg (250 lb)    General: sleeping and only arousable w/ painful sternal rub.  Eyes:  pupils sluggish and dilated but reactive to light laterally ENT:  dry mucous membranes Neck:  no LAD, masses or thyromegaly Cardiovascular:  RRR, no m/r/g. No LE edema. Telemetry:  SR, no arrhythmias  Respiratory:  CTA bilaterally, no w/r/r. Normal respiratory effort. Abdomen:  soft, ntnd Skin:  no rash or induration seen on limited exam Musculoskeletal:  right wrist and hand in splint, moves all extremities on command, no edema or other bony abnormality or malformation noted. Psychiatric:  patient is responsive only after painful sternal rub. Unable to further assess at this time.  Neurologic:  unable to fully assess as patient is obtunded. Patient will follow basic commands and moving extremities and will do so in a coordinated  fashion. .          Labs on Admission:  Basic Metabolic Panel:  Recent Labs Lab 04/09/15 1503  NA 142  K 3.8  CL 110  CO2 24  GLUCOSE 101*  BUN 18  CREATININE 0.93  CALCIUM 9.0   Liver Function Tests:  Recent Labs Lab 04/09/15 1503  AST 16  ALT 17  ALKPHOS 42  BILITOT 1.1  PROT 6.9  ALBUMIN 4.1   No results for input(s):  LIPASE, AMYLASE in the last 168 hours. No results for input(s): AMMONIA in the last 168 hours. CBC:  Recent Labs Lab 04/09/15 1503  WBC 10.2  HGB 16.3  HCT 48.9  MCV 87.6  PLT 193   Cardiac Enzymes: No results for input(s): CKTOTAL, CKMB, CKMBINDEX, TROPONINI in the last 168 hours.  BNP (last 3 results) No results for input(s): BNP in the last 8760 hours.  ProBNP (last 3 results) No results for input(s): PROBNP in the last 8760 hours.  CBG: No results for input(s): GLUCAP in the last 168 hours.  Radiological Exams on Admission: No results found.  EKG: Independently reviewed. Sinus, normal QT, no sign of ACS  Assessment/Plan Principal Problem:   Acute encephalopathy Active Problems:   Drug overdose   Suicide attempt   Tobacco use disorder   Right hand fracture   Acute encephalopathy: likely secondary to drug overdose. Patient noted to have ingested a large amount of Klonopin and Neurontin. Preserving airway. EKG without noted arrhythmia. UDS positive for benzos, opiates, marijuana. . Window for activated charcoal and will essentially need close monitoring. Patient with home prescriptions for Klonopin and Vicodin. WBC 10.2, afebrile vital signs stable. - Stepdown - Telemetry - Lactic acid - O2 when necessary - CIWA - NPO - IVF  Suicide attempt: This is patient's third suicide attempt in the past 4 weeks. Patient's girlfriend is very concerned this patient continues to attempt suicide gets admitted but then fails to follow-up once leaving the hospital. aspirin level normal, Tylenol level normal. EtOH normal  - Sitter in room - Psychiatry evaluation. Patient likely to need prolonged inpatient therapy.  Tobacco use: - Nicotine patch  Right hand fracture: Occurred on 02/20/2015. Followed by with orthopedic surgery outpatient. Per girlfriend there is some concern for nonhealing of the fracture. Patient is awake consider discussing further inpatient management with his  orthopedic surgeon. - Continue right hand splint - NSAIDs/Tylenol for pain    Code Status: FULL DVT Prophylaxis: Hep Family Communication: Girlfriend Disposition Plan: pending improvement  Jabri Blancett J, MD Family Medicine [facility] [email or web address] Password [email or web address]

## 2015-04-09 NOTE — ED Notes (Signed)
Per EMS, Pt, from home, c/o intentional ingestion of 57mg  Klonopin and unknown amount of gabapentin around 1300.  Pt reported taking medication because he is "frustrated and tried to calm down."  [employer] reports that the Pt took the medication because "he's lost everything and wants it all to go away."

## 2015-04-09 NOTE — ED Notes (Addendum)
Mother, Adam Lyons, lives in [place].  Phone [phone].  Per [employer], they have contacted magistrate's office and no record of IVC paperwork has been filed on pt.

## 2015-04-09 NOTE — ED Notes (Signed)
Bed: RESA Expected date:  Expected time:  Means of arrival:  Comments: EMS- OD klonopin and neurotin

## 2015-04-09 NOTE — ED Notes (Addendum)
Poison Control Notified Recommendations:  Will see CNS sedation with medications pt ingested.   Post 4 hour acetaminophen level.  monitor 6 hours, if lab results are stable then up to physician for for medical clearance.

## 2015-04-09 NOTE — ED Notes (Signed)
[employer] reports that the Pt's fiance is going to take out papers.

## 2015-04-09 NOTE — ED Notes (Signed)
Attempted to called report to floor was told RN in report and unable to take report at this time.  Was told to call [phone] to [facility] later for report.

## 2015-04-09 NOTE — ED Notes (Signed)
Patient has 1 belonging bag at nurse's desk on blue side. Bag includes: white sneakers, white plaid shorts with beige belt, blue athletic shorts, blue brief, keys, blue t-shirt, black cell phone, silver lighter and open pack of cigarettes in pt's shorts pocket. .Marland Kitchen

## 2015-04-10 DIAGNOSIS — T424X2A Poisoning by benzodiazepines, intentional self-harm, initial encounter: Secondary | ICD-10-CM

## 2015-04-10 DIAGNOSIS — F319 Bipolar disorder, unspecified: Secondary | ICD-10-CM

## 2015-04-10 DIAGNOSIS — T426X2A Poisoning by other antiepileptic and sedative-hypnotic drugs, intentional self-harm, initial encounter: Principal | ICD-10-CM

## 2015-04-10 DIAGNOSIS — R45851 Suicidal ideations: Secondary | ICD-10-CM

## 2015-04-10 LAB — COMPREHENSIVE METABOLIC PANEL
ALT: 18 U/L (ref 17–63)
AST: 19 U/L (ref 15–41)
Albumin: 4 g/dL (ref 3.5–5.0)
Alkaline Phosphatase: 42 U/L (ref 38–126)
Anion gap: 6 (ref 5–15)
BILIRUBIN TOTAL: 1.3 mg/dL — AB (ref 0.3–1.2)
BUN: 20 mg/dL (ref 6–20)
CHLORIDE: 110 mmol/L (ref 101–111)
CO2: 25 mmol/L (ref 22–32)
Calcium: 9 mg/dL (ref 8.9–10.3)
Creatinine, Ser: 0.92 mg/dL (ref 0.61–1.24)
GFR calc non Af Amer: >60 mL/min (ref >60)
GLUCOSE: 134 mg/dL — AB (ref 70–99)
Potassium: 3.8 mmol/L (ref 3.5–5.1)
Sodium: 141 mmol/L (ref 135–145)
TOTAL PROTEIN: 6.8 g/dL (ref 6.5–8.1)

## 2015-04-10 LAB — CBC
HCT: 50.1 % (ref 39.0–52.0)
Hemoglobin: 16.4 g/dL (ref 13.0–17.0)
MCH: 28.9 pg (ref 26.0–34.0)
MCHC: 32.7 g/dL (ref 30.0–36.0)
MCV: 88.4 fL (ref 78.0–100.0)
PLATELETS: 203 10*3/uL (ref 150–400)
RBC: 5.67 MIL/uL (ref 4.22–5.81)
RDW: 13.4 % (ref 11.5–15.5)
WBC: 8.5 10*3/uL (ref 4.0–10.5)

## 2015-04-10 MED ORDER — OXYCODONE HCL 5 MG PO TABS
5.0000 mg | ORAL_TABLET | Freq: Four times a day (QID) | ORAL | Status: DC | PRN
  Administered 2015-04-10 – 2015-04-11 (×4): 5 mg via ORAL
  Filled 2015-04-10 (×5): qty 1

## 2015-04-10 MED ORDER — HYDROXYZINE HCL 25 MG PO TABS
25.0000 mg | ORAL_TABLET | Freq: Four times a day (QID) | ORAL | Status: DC | PRN
  Administered 2015-04-10 – 2015-04-11 (×2): 25 mg via ORAL
  Filled 2015-04-10 (×2): qty 1

## 2015-04-10 MED ORDER — OXYCODONE HCL 5 MG PO TABS: 5.0000 mg | ORAL_TABLET | Freq: Four times a day (QID) | ORAL | Status: DC | PRN

## 2015-04-10 NOTE — Progress Notes (Signed)
Nutrition Brief Note  Patient identified on the Malnutrition Screening Tool (MST) Report  Wt Readings from Last 15 Encounters:  04/09/15 208 lb 8.9 oz (94.6 kg)  08/13/13 250 lb (113.399 kg)    Body mass index is 29.1 kg/(m^2). Patient meets criteria for overweight based on current BMI.   Current diet order is regular, patient is consuming approximately 100% of meals at this time. Labs and medications reviewed.   Per RN, pt is eating well and ate 100% x 2 trays this am. No supplements needed at this time.   No nutrition interventions warranted at this time. If nutrition issues arise, please consult RD.   Emmaline KluverHaley Sabastion Hrdlicka MS, RD, LDN [phone]

## 2015-04-10 NOTE — Care Management Note (Addendum)
Case Management Note  Patient Details  Name: Adam SinnerDaniel Meth MRN: [id number] Date of Birth: 02-26-1986  Subjective/Objective:                 overdose  Action/Plan: Self care once cleared by psych  Expected Discharge Date:  [id number]               Expected Discharge Plan:  Home/Self Care  In-House Referral:  NA  Discharge planning Services  CM Consult  Post Acute Care Choice:  NA Choice offered to:  NA  DME Arranged:    DME Agency:     HH Arranged:    HH Agency:     Status of Service:  In process, will continue to follow  Medicare Important Message Given:    Date Medicare IM Given:    Medicare IM give by:    Date Additional Medicare IM Given:    Additional Medicare Important Message give by:     If discussed at Long Length of Stay Meetings, dates discussed:    Additional Comments:  Golda AcreDavis, Kiwan Gadsden Lynn, RN 04/10/2015, 11:02 AM

## 2015-04-10 NOTE — Progress Notes (Signed)
  PROGRESS NOTE  Adam SinnerDaniel Lyons EAV:[id number] DOB: 1986-11-06 DOA: 04/09/2015 PCP: No PCP Per Patient  Summary: 29 year old man presented with intentional Neurontin and Klonopin overdose. Supportive care recommended by poison control. Admitted for medical stabilization.  Assessment/Plan: 1. Suicide attempt, intentional overdose with Neurontin and Klonopin. No apparent sequela. 2. Acute encephalopathy secondary to overdose. Resolved. 3. Right hand fracture present on admission. Patient had appointment with Dr. Ophelia CharterYates today. I left a message notifying him of hospitalization.   Patient's medically stable at this point. Transfer to medical floor. He is clear from a medical standpoint.   Psychiatry evaluation for further evaluation.  Discussed with mother at bedside with patient's permission.  Code Status: full code DVT prophylaxis: SCDs Family Communication:  Disposition Plan: pending psychiatry recommendations  Adam Sacksaniel Goodrich, MD  [facility]  Pager [phone] If 7PM-7AM, please contact night-coverage at [email or web address], password [email or web address] 04/10/2015, 1:38 PM  LOS: 1 day   Consultants:    Procedures:    Antibiotics:    HPI/Subjective: Much better today. No nausea or vomiting. Right hand pain from previous fracture.  Discussed with nursing, no concerns. Poison control has closed case.  Objective: Filed Vitals:   04/10/15 0800 04/10/15 0900 04/10/15 1000 04/10/15 1200  BP: 123/73 122/59 151/73 135/109  Pulse: 89 87 95 76  Temp:    97.8 F (36.6 C)  TempSrc:    Oral  Resp: 25 19 19 15   Height:      Weight:      SpO2: 98% 99% 99% 95%    Intake/Output Summary (Last 24 hours) at 04/10/15 1338 Last data filed at 04/10/15 1200  Gross per 24 hour  Intake 2239.58 ml  Output   1300 ml  Net 939.58 ml     Filed Weights   04/09/15 1945  Weight: 94.6 kg (208 lb 8.9 oz)    Exam:     Afebrile, VSS, no hypoxia General:  Appears calm and  comfortable Cardiovascular: RRR, no m/r/g.  Respiratory: CTA bilaterally, no w/r/r. Normal respiratory effort. Psychiatric: grossly normal mood and affect, speech fluent and appropriate  New data reviewed:  Complete metabolic panel unremarkable  CBC unremarkable  Pertinent data since admission:  Salicylate, Tylenol, alcohol levels negative  Urine drug screen positive for tetrahydrocannabinol, opiates, benzodiazepines  Scheduled Meds: . heparin  5,000 Units Subcutaneous 3 times per day  . nicotine  21 mg Transdermal Daily  . sodium chloride  3 mL Intravenous Q12H   Continuous Infusions:   Principal Problem:   Drug overdose Active Problems:   Acute encephalopathy   Suicide attempt   Tobacco use disorder   Right hand fracture   Time spent 20 minutes

## 2015-04-10 NOTE — Progress Notes (Addendum)
Woman named stacy called several times stating that she is the pts fiance asking for information over the phone. No information was given to woman due to lack of written documentation for release of information. The woman was told she should talk directly to the family.
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
## 2015-04-10 NOTE — Progress Notes (Signed)
Date:  Apr 10, 2015 U.R. performed for needs and level of care. Will continue to follow for Case Management needs.  Hester Forget, RN, BSN, CCM   [phone] 

## 2015-04-11 ENCOUNTER — Encounter (HOSPITAL_COMMUNITY): Payer: Self-pay | Admitting: *Deleted

## 2015-04-11 ENCOUNTER — Inpatient Hospital Stay (HOSPITAL_COMMUNITY)
Admission: AD | Admit: 2015-04-11 | Discharge: 2015-04-14 | DRG: 885 | Disposition: A | Discharge status: Home / Self Care | Payer: BLUE CROSS/BLUE SHIELD | Source: Intra-hospital | Attending: Psychiatry | Admitting: Psychiatry

## 2015-04-11 DIAGNOSIS — F17210 Nicotine dependence, cigarettes, uncomplicated: Secondary | ICD-10-CM | POA: Diagnosis present

## 2015-04-11 DIAGNOSIS — F1220 Cannabis dependence, uncomplicated: Secondary | ICD-10-CM | POA: Diagnosis present

## 2015-04-11 DIAGNOSIS — T50901A Poisoning by unspecified drugs, medicaments and biological substances, accidental (unintentional), initial encounter: Secondary | ICD-10-CM | POA: Diagnosis present

## 2015-04-11 DIAGNOSIS — F1010 Alcohol abuse, uncomplicated: Secondary | ICD-10-CM | POA: Diagnosis present

## 2015-04-11 DIAGNOSIS — F17200 Nicotine dependence, unspecified, uncomplicated: Secondary | ICD-10-CM | POA: Diagnosis present

## 2015-04-11 DIAGNOSIS — T426X2A Poisoning by other antiepileptic and sedative-hypnotic drugs, intentional self-harm, initial encounter: Secondary | ICD-10-CM | POA: Diagnosis not present

## 2015-04-11 DIAGNOSIS — F332 Major depressive disorder, recurrent severe without psychotic features: Secondary | ICD-10-CM | POA: Diagnosis present

## 2015-04-11 DIAGNOSIS — T424X2A Poisoning by benzodiazepines, intentional self-harm, initial encounter: Secondary | ICD-10-CM | POA: Diagnosis not present

## 2015-04-11 DIAGNOSIS — T1491 Suicide attempt: Secondary | ICD-10-CM | POA: Diagnosis not present

## 2015-04-11 DIAGNOSIS — S6291XG Unspecified fracture of right wrist and hand, subsequent encounter for fracture with delayed healing: Secondary | ICD-10-CM | POA: Diagnosis not present

## 2015-04-11 DIAGNOSIS — T50902S Poisoning by unspecified drugs, medicaments and biological substances, intentional self-harm, sequela: Secondary | ICD-10-CM

## 2015-04-11 DIAGNOSIS — G9340 Encephalopathy, unspecified: Secondary | ICD-10-CM | POA: Diagnosis not present

## 2015-04-11 MED ORDER — IBUPROFEN 400 MG PO TABS
400.0000 mg | ORAL_TABLET | Freq: Four times a day (QID) | ORAL | Status: DC | PRN
  Administered 2015-04-11: 400 mg via ORAL
  Filled 2015-04-11: qty 1

## 2015-04-11 MED ORDER — MAGNESIUM HYDROXIDE 400 MG/5ML PO SUSP: 30.0000 mL | Freq: Every day | ORAL | Status: DC | PRN

## 2015-04-11 MED ORDER — NICOTINE POLACRILEX 2 MG MT GUM
2.0000 mg | CHEWING_GUM | OROMUCOSAL | Status: DC | PRN
  Administered 2015-04-11 – 2015-04-12 (×2): 2 mg via ORAL
  Filled 2015-04-11 (×2): qty 1

## 2015-04-11 MED ORDER — IBUPROFEN 400 MG PO TABS
400.0000 mg | ORAL_TABLET | Freq: Once | ORAL | Status: AC
  Administered 2015-04-11: 400 mg via ORAL
  Filled 2015-04-11 (×2): qty 1

## 2015-04-11 MED ORDER — TRAZODONE HCL 50 MG PO TABS
50.0000 mg | ORAL_TABLET | Freq: Every evening | ORAL | Status: DC | PRN
  Administered 2015-04-11 – 2015-04-13 (×6): 50 mg via ORAL
  Filled 2015-04-11 (×3): qty 1
  Filled 2015-04-11: qty 6
  Filled 2015-04-11 (×6): qty 1
  Filled 2015-04-11 (×3): qty 6
  Filled 2015-04-11 (×2): qty 1

## 2015-04-11 MED ORDER — NICOTINE 21 MG/24HR TD PT24: 21.0000 mg | MEDICATED_PATCH | Freq: Every day | TRANSDERMAL | Status: DC

## 2015-04-11 MED ORDER — ACETAMINOPHEN 325 MG PO TABS
650.0000 mg | ORAL_TABLET | Freq: Four times a day (QID) | ORAL | Status: DC | PRN
  Administered 2015-04-11 – 2015-04-13 (×2): 650 mg via ORAL
  Filled 2015-04-11 (×2): qty 2

## 2015-04-11 MED ORDER — TRAZODONE HCL 50 MG PO TABS: 50.0000 mg | ORAL_TABLET | Freq: Every evening | ORAL | Status: DC | PRN

## 2015-04-11 MED ORDER — IBUPROFEN 800 MG PO TABS
800.0000 mg | ORAL_TABLET | Freq: Three times a day (TID) | ORAL | Status: DC | PRN
  Administered 2015-04-12 – 2015-04-14 (×4): 800 mg via ORAL
  Filled 2015-04-11 (×4): qty 1

## 2015-04-11 MED ORDER — PNEUMOCOCCAL VAC POLYVALENT 25 MCG/0.5ML IJ INJ
0.5000 mL | INJECTION | INTRAMUSCULAR | Status: AC
  Administered 2015-04-12: 0.5 mL via INTRAMUSCULAR

## 2015-04-11 MED ORDER — ALPRAZOLAM 1 MG PO TABS
1.0000 mg | ORAL_TABLET | Freq: Three times a day (TID) | ORAL | Status: DC | PRN
  Administered 2015-04-11: 1 mg via ORAL
  Filled 2015-04-11: qty 1

## 2015-04-11 MED ORDER — NICOTINE 21 MG/24HR TD PT24
21.0000 mg | MEDICATED_PATCH | Freq: Every day | TRANSDERMAL | Status: DC
  Filled 2015-04-11: qty 1

## 2015-04-11 MED ORDER — ALUM & MAG HYDROXIDE-SIMETH 200-200-20 MG/5ML PO SUSP: 30.0000 mL | ORAL | Status: DC | PRN

## 2015-04-11 MED ORDER — ENSURE ENLIVE PO LIQD
237.0000 mL | Freq: Two times a day (BID) | ORAL | Status: DC
  Administered 2015-04-12 – 2015-04-13 (×3): 237 mL via ORAL

## 2015-04-11 MED ORDER — HYDROXYZINE HCL 50 MG PO TABS
50.0000 mg | ORAL_TABLET | Freq: Three times a day (TID) | ORAL | Status: DC | PRN
  Administered 2015-04-11 – 2015-04-12 (×3): 50 mg via ORAL
  Filled 2015-04-11 (×3): qty 1
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
## 2015-04-11 NOTE — Discharge Instructions (Signed)
Narcotic Overdose  A narcotic overdose is the misuse or overuse of a narcotic drug. A narcotic overdose can make you pass out and stop breathing. If you are not treated right away, this can cause permanent brain damage or stop your heart. Medicine may be given to reverse the effects of an overdose. If so, this medicine may bring on withdrawal symptoms. The symptoms may be abdominal cramps, throwing up (vomiting), sweating, chills, and nervousness.  Injecting narcotics can cause more problems than just an overdose. AIDS, hepatitis, and other very serious infections are transmitted by sharing needles and syringes. If you decide to quit using, there are medicines which can help you through the withdrawal period. Trying to quit all at once on your own can be uncomfortable, but not life-threatening. Call your caregiver, Narcotics Anonymous, or any drug and alcohol treatment program for further help.   Document Released: 12/25/2004 Document Revised: 02/09/2012 Document Reviewed: 10/19/2009  ExitCare® Patient Information ©2015 [employer]. This information is not intended to replace advice given to you by your health care provider. Make sure you discuss any questions you have with your health care provider.

## 2015-04-11 NOTE — Progress Notes (Signed)
Pt did not attend group this evening.  

## 2015-04-11 NOTE — Care Management Note (Signed)
Case Management Note  Patient Details  Name: [name] MRN: [id number] Date of Birth: 06/30/1986  Subjective/Objective:       29 yo male admitted with suicide attempt             Action/Plan:  Received consult for PCP needs. Patient confirmed that he has [employer] and stated that he has not established a PCP because his insurance coverage is recent. Explained to the patient that [employer] has a Youth workerprivate web site for members that he can login to find local providers to establish care. Also provided the patient with the Health Connect number that he can call for assistance in finding a local provider with BCBS and established care. Patient stated that he prefers to wait to make an appointment because he feels that he may need to get further mental health care first.  Expected Discharge Date:   (unknown)               Expected Discharge Plan:     In-House Referral:  Clinical Social Work  Discharge planning Services  CM Consult  Post Acute Care Choice:  NA Choice offered to:  NA  DME Arranged:    DME Agency:     HH Arranged:    HH Agency:     Status of Service:  Completed, signed off  Medicare Important Message Given:    Date Medicare IM Given:    Medicare IM give by:    Date Additional Medicare IM Given:    Additional Medicare Important Message give by:     If discussed at Long Length of Stay Meetings, dates discussed:    Additional Comments:  Gerrit HallsMuza, Niajah Sipos S, RN 04/11/2015, 11:53 AM

## 2015-04-11 NOTE — Discharge Summary (Signed)
Physician Discharge Summary  Royston SinnerDaniel Justiss ZOX:[id number] DOB: [birth year] DOA: 04/09/2015  PCP: No PCP Per Patient;  Case manager to help establish patient primary in [facility].  Admit date: 04/09/2015 Discharge date: 04/11/2015  Recommendations for Outpatient Follow-up:  1.  patient will follow-up with primary care physician per scheduled appointment. No new changes in medications on discharge.  Discharge Diagnoses:  Principal Problem:   Suicide attempt Active Problems:   Major depressive disorder, recurrent severe without psychotic features   Drug overdose   Acute encephalopathy   Tobacco use disorder   Right hand fracture    Discharge Condition: stable   Diet recommendation: as tolerated   History of present illness:  29 year old man presented with intentional Neurontin and Klonopin overdose. Supportive care was recommended by poison control.   patient is medically optimized. Per psychiatry, patient will benefit from placement to behavioral health.  Hospital Course:  Assessment/Plan:   Principal problem:  Suicidal attempt by intentional overdose with Neurontin and Klonopin -  Patient will be discharged to behavioral health. Klonopin was resumed on discharge on as needed basis. We will defer to psychiatry to dispense this medication if appropriate. Prescription for Klonopin is not provided. -   Sitter at bedside   Active problems:  Acute drug-induced encephalopathy -  Secondary to drug overdose. Mental status at baseline this morning. Patient is alert, oriented.  Right hand fracture   -  In  [place] -  Outpatient follow-up    Signed:  Manson PasseyEVINE, Rojean Ige, MD  [facility] 04/11/2015, 10:39 AM  Pager #: [phone]  Time spent in minutes: less than 30 minutes  Discharge Exam: Filed Vitals:   04/11/15 0712  BP: 116/76  Pulse: 75  Temp: 97.8 F (36.6 C)  Resp: 18   Filed Vitals:   04/10/15 1359 04/10/15 2230 04/11/15 0625  04/11/15 0712  BP: 134/85 113/67 136/72 116/76  Pulse: 91 59 63 75  Temp: 98.1 F (36.7 C) 98.2 F (36.8 C) 97.3 F (36.3 C) 97.8 F (36.6 C)  TempSrc: Oral Oral Oral Oral  Resp: 20 18 16 18   Height:      Weight:      SpO2: 98% 98% 98% 98%    General: Pt is alert, follows commands appropriately, not in acute distress Cardiovascular: Regular rate and rhythm, S1/S2 +, no murmurs Respiratory: Clear to auscultation bilaterally, no wheezing, no crackles, no rhonchi Abdominal: Soft, non tender, non distended, bowel sounds +, no guarding Extremities: no edema, no cyanosis, pulses palpable bilaterally DP and PT Neuro: Grossly nonfocal  Discharge Instructions  Discharge Instructions    Call MD for:  difficulty breathing, headache or visual disturbances    Complete by:  As directed      Call MD for:  persistant nausea and vomiting    Complete by:  As directed      Call MD for:  severe uncontrolled pain    Complete by:  As directed      Diet - low sodium heart healthy    Complete by:  As directed      Increase activity slowly    Complete by:  As directed             Medication List    STOP taking these medications        HYDROcodone-acetaminophen 5-325 MG per tablet  Commonly known as:  NORCO     naproxen 500 MG tablet  Commonly known as:  NAPROSYN     permethrin 5 %  cream  Commonly known as:  ELIMITE      TAKE these medications        clonazePAM 1 MG tablet  Commonly known as:  KLONOPIN  Take 1 mg by mouth 3 (three) times daily as needed for anxiety (anxiety).     gabapentin 300 MG capsule  Commonly known as:  NEURONTIN  Take 1 capsule (300 mg total) by mouth at bedtime.     nicotine 21 mg/24hr patch  Commonly known as:  NICODERM CQ - dosed in mg/24 hours  Place 1 patch (21 mg total) onto the skin daily.          The results of significant diagnostics from this hospitalization (including imaging, microbiology, ancillary and laboratory) are listed below  for reference.    Significant Diagnostic Studies: Dg Hand 2 View Right  03/15/2015   CLINICAL DATA:  Status post screw and plate fixation for fracture fourth metacarpal. Recheck of alignment  EXAM: RIGHT HAND - 2 VIEW  COMPARISON:  February 20, 2015  FINDINGS: Frontal and lateral views were obtained. There is screw and plate fixation through a fracture of the mid portion of the fourth metacarpal with alignment anatomic. No new fracture. No dislocation. Joint spaces appear intact.  IMPRESSION: Status post screw and plate fixation through a fracture of the fourth metacarpal, mid portion, with alignment anatomic. No new injury. Joint spaces intact.   Electronically Signed   By: Bretta BangWilliam  Woodruff III M.D.   On: 03/15/2015 16:31    Microbiology: Recent Results (from the past 240 hour(s))  MRSA PCR Screening     Status: None   Collection Time: 04/09/15  7:25 PM  Result Value Ref Range Status   MRSA by PCR NEGATIVE NEGATIVE Final    Comment:        The GeneXpert MRSA Assay (FDA approved for NASAL specimens only), is one component of a comprehensive MRSA colonization surveillance program. It is not intended to diagnose MRSA infection nor to guide or monitor treatment for MRSA infections.      Labs: Basic Metabolic Panel:  Recent Labs Lab 04/09/15 1503 04/10/15 0359  NA 142 141  K 3.8 3.8  CL 110 110  CO2 24 25  GLUCOSE 101* 134*  BUN 18 20  CREATININE 0.93 0.92  CALCIUM 9.0 9.0  MG 2.1  --    Liver Function Tests:  Recent Labs Lab 04/09/15 1503 04/10/15 0359  AST 16 19  ALT 17 18  ALKPHOS 42 42  BILITOT 1.1 1.3*  PROT 6.9 6.8  ALBUMIN 4.1 4.0   No results for input(s): LIPASE, AMYLASE in the last 168 hours. No results for input(s): AMMONIA in the last 168 hours. CBC:  Recent Labs Lab 04/09/15 1503 04/10/15 0359  WBC 10.2 8.5  HGB 16.3 16.4  HCT 48.9 50.1  MCV 87.6 88.4  PLT 193 203   Cardiac Enzymes: No results for input(s): CKTOTAL, CKMB, CKMBINDEX,  TROPONINI in the last 168 hours. BNP: BNP (last 3 results) No results for input(s): BNP in the last 8760 hours.  ProBNP (last 3 results) No results for input(s): PROBNP in the last 8760 hours.  CBG: No results for input(s): GLUCAP in the last 168 hours.

## 2015-04-11 NOTE — Progress Notes (Signed)
Per Julieanne Cottonina, [facility] at [facility], Pt has been accepted to room 430-2. Can be transported after 4:30pm. RN notified.  Pt [facility].  CSW to call [facility] for transport when ready.  CSW signing off but available if needs arise.   Chad CordialLauren Carter, LCSWA 04/11/2015 2:10 PM [phone]

## 2015-04-11 NOTE — Tx Team (Signed)
Initial Interdisciplinary Treatment Plan   PATIENT STRESSORS: Financial difficulties Marital or family conflict Medication change or noncompliance Occupational concerns Traumatic event   PATIENT STRENGTHS: Capable of independent living Communication skills Supportive family/friends   PROBLEM LIST: Problem List/Patient Goals Date to be addressed Date deferred Reason deferred Estimated date of resolution  Safety/Suicide Risk 04/11/2015     Depression 04/11/2015     Anxiety 04/11/2015     "Help me not feel so insecure about certain things." 04/11/2015     "Stop trying to control things that are out of my control." 04/11/2015                              DISCHARGE CRITERIA:  Improved stabilization in mood, thinking, and/or behavior Verbal commitment to aftercare and medication compliance  PRELIMINARY DISCHARGE PLAN: Outpatient therapy Return to previous living arrangement  PATIENT/FAMIILY INVOLVEMENT: This treatment plan has been presented to and reviewed with the patient, Adam Lyons.  The patient and family have been given the opportunity to ask questions and make suggestions.  Adam Lyons, Adam Lyons 04/11/2015, 5:21 PM

## 2015-04-12 ENCOUNTER — Encounter (HOSPITAL_COMMUNITY): Payer: Self-pay | Admitting: Registered Nurse

## 2015-04-12 DIAGNOSIS — T50901A Poisoning by unspecified drugs, medicaments and biological substances, accidental (unintentional), initial encounter: Secondary | ICD-10-CM

## 2015-04-12 LAB — URINALYSIS, ROUTINE W REFLEX MICROSCOPIC
Bilirubin Urine: NEGATIVE
Glucose, UA: NEGATIVE mg/dL
KETONES UR: 15 mg/dL — AB
Nitrite: POSITIVE — AB
Protein, ur: 100 mg/dL — AB
SPECIFIC GRAVITY, URINE: 1.028 (ref 1.005–1.030)
Urobilinogen, UA: 0.2 mg/dL (ref 0.0–1.0)
pH: 7 (ref 5.0–8.0)

## 2015-04-12 LAB — URINE MICROSCOPIC-ADD ON

## 2015-04-12 MED ORDER — LOPERAMIDE HCL 2 MG PO CAPS: 2.0000 mg | ORAL_CAPSULE | ORAL | Status: DC | PRN

## 2015-04-12 MED ORDER — ONDANSETRON 4 MG PO TBDP
4.0000 mg | ORAL_TABLET | Freq: Four times a day (QID) | ORAL | Status: DC | PRN
Start: 2015-04-12 — End: 2015-04-14

## 2015-04-12 MED ORDER — HYDROXYZINE HCL 25 MG PO TABS
25.0000 mg | ORAL_TABLET | Freq: Four times a day (QID) | ORAL | Status: DC | PRN
  Filled 2015-04-12: qty 6

## 2015-04-12 MED ORDER — ADULT MULTIVITAMIN W/MINERALS CH
1.0000 | ORAL_TABLET | Freq: Every day | ORAL | Status: DC
  Administered 2015-04-12: 1 via ORAL
  Filled 2015-04-12 (×4): qty 1

## 2015-04-12 MED ORDER — VITAMIN B-1 100 MG PO TABS
100.0000 mg | ORAL_TABLET | Freq: Every day | ORAL | Status: DC
  Administered 2015-04-13 – 2015-04-14 (×2): 100 mg via ORAL
  Filled 2015-04-12 (×5): qty 1

## 2015-04-12 MED ORDER — ADULT MULTIVITAMIN W/MINERALS CH
1.0000 | ORAL_TABLET | Freq: Every day | ORAL | Status: DC
  Administered 2015-04-13 – 2015-04-14 (×2): 1 via ORAL
  Filled 2015-04-12 (×5): qty 1

## 2015-04-12 MED ORDER — ONDANSETRON 4 MG PO TBDP: 4.0000 mg | ORAL_TABLET | Freq: Four times a day (QID) | ORAL | Status: DC | PRN

## 2015-04-12 MED ORDER — CHLORDIAZEPOXIDE HCL 25 MG PO CAPS
25.0000 mg | ORAL_CAPSULE | Freq: Four times a day (QID) | ORAL | Status: DC
  Administered 2015-04-12 (×2): 25 mg via ORAL
  Filled 2015-04-12 (×2): qty 1

## 2015-04-12 MED ORDER — GABAPENTIN 300 MG PO CAPS
300.0000 mg | ORAL_CAPSULE | Freq: Every day | ORAL | Status: DC
  Filled 2015-04-12 (×2): qty 1

## 2015-04-12 MED ORDER — LORAZEPAM 1 MG PO TABS: 1.0000 mg | ORAL_TABLET | Freq: Four times a day (QID) | ORAL | Status: DC | PRN

## 2015-04-12 MED ORDER — LORAZEPAM 1 MG PO TABS
1.0000 mg | ORAL_TABLET | Freq: Three times a day (TID) | ORAL | Status: DC
  Administered 2015-04-13 – 2015-04-14 (×2): 1 mg via ORAL
  Filled 2015-04-12 (×2): qty 1

## 2015-04-12 MED ORDER — CHLORDIAZEPOXIDE HCL 25 MG PO CAPS: 25.0000 mg | ORAL_CAPSULE | ORAL | Status: DC

## 2015-04-12 MED ORDER — GABAPENTIN 100 MG PO CAPS
200.0000 mg | ORAL_CAPSULE | Freq: Two times a day (BID) | ORAL | Status: DC
  Administered 2015-04-12 – 2015-04-14 (×4): 200 mg via ORAL
  Filled 2015-04-12 (×4): qty 2
  Filled 2015-04-12 (×2): qty 12
  Filled 2015-04-12 (×3): qty 2
  Filled 2015-04-12: qty 12
  Filled 2015-04-12 (×3): qty 2
  Filled 2015-04-12: qty 12

## 2015-04-12 MED ORDER — THIAMINE HCL 100 MG/ML IJ SOLN: 100.0000 mg | Freq: Once | INTRAMUSCULAR | Status: DC

## 2015-04-12 MED ORDER — NICOTINE 21 MG/24HR TD PT24
21.0000 mg | MEDICATED_PATCH | Freq: Every day | TRANSDERMAL | Status: DC
  Administered 2015-04-12 – 2015-04-14 (×3): 21 mg via TRANSDERMAL
  Filled 2015-04-12 (×6): qty 1

## 2015-04-12 MED ORDER — CHLORDIAZEPOXIDE HCL 25 MG PO CAPS: 25.0000 mg | ORAL_CAPSULE | Freq: Four times a day (QID) | ORAL | Status: DC | PRN

## 2015-04-12 MED ORDER — CHLORDIAZEPOXIDE HCL 25 MG PO CAPS: 25.0000 mg | ORAL_CAPSULE | Freq: Every day | ORAL | Status: DC

## 2015-04-12 MED ORDER — DULOXETINE HCL 30 MG PO CPEP
30.0000 mg | ORAL_CAPSULE | Freq: Every day | ORAL | Status: DC
  Administered 2015-04-13 – 2015-04-14 (×2): 30 mg via ORAL
  Filled 2015-04-12: qty 3
  Filled 2015-04-12: qty 1
  Filled 2015-04-12: qty 3
  Filled 2015-04-12 (×3): qty 1

## 2015-04-12 MED ORDER — LORAZEPAM 1 MG PO TABS
1.0000 mg | ORAL_TABLET | Freq: Every day | ORAL | Status: AC
  Administered 2015-04-14: 1 mg via ORAL

## 2015-04-12 MED ORDER — LORAZEPAM 1 MG PO TABS: 1.0000 mg | ORAL_TABLET | Freq: Two times a day (BID) | ORAL | Status: DC

## 2015-04-12 MED ORDER — LORAZEPAM 1 MG PO TABS
1.0000 mg | ORAL_TABLET | Freq: Four times a day (QID) | ORAL | Status: AC
  Administered 2015-04-12 – 2015-04-13 (×4): 1 mg via ORAL
  Filled 2015-04-12 (×4): qty 1

## 2015-04-12 MED ORDER — CHLORDIAZEPOXIDE HCL 25 MG PO CAPS: 25.0000 mg | ORAL_CAPSULE | Freq: Three times a day (TID) | ORAL | Status: DC

## 2015-04-12 NOTE — [facility] Suicide Risk Assessment (Signed)
[facility] INPATIENT:  Family/Significant Other Suicide Prevention Education  Suicide Prevention Education:  Education Completed: Mardene CelesteStacey Roy, Finance - [phone]; has been identified by the patient as the family member/significant other with whom the patient will be residing, and identified as the person(s) who will aid the patient in the event of a mental health crisis (suicidal ideations/suicide attempt).  With written consent from the patient, the family member/significant other has been provided the following suicide prevention education, prior to the and/or following the discharge of the patient.  The suicide prevention education provided includes the following:  Suicide risk factors  Suicide prevention and interventions  National Suicide Hotline telephone number  [facility] assessment telephone number  [facility] Emergency Assistance 911  [facility] and/or Residential Mobile Crisis Unit telephone number  Request made of family/significant other to:  Remove weapons (e.g., guns, rifles, knives), all items previously/currently identified as safety concern.   Finance advised patient does not have access to weapons.     Remove drugs/medications (over-the-counter, prescriptions, illicit drugs), all items previously/currently identified as a safety concern.  The family member/significant other verbalizes understanding of the suicide prevention education information provided.  The family member/significant other agrees to remove the items of safety concern listed above.  Wynn BankerHodnett, Ormond Lazo Hairston 04/12/2015, 4:04 PM
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
## 2015-04-12 NOTE — Progress Notes (Signed)
Patient ID: Adam SinnerDaniel Lyons, male   DOB: 05-Oct-1986, 29 y.o.   MRN: [id number]   Pt currently presents with a flat affect and depressed/anxious behavior. Pt reports "I want to call my mom but I don't want to talk to my girlfriend. All I here is "nag, nag, nag" when she talks. I cannot even pee right because I here her in my head." Pt continuously blames his fiance for problems he has been having lately. Pt seen talking with her throughout the day on the telephone.  Pt provided with medications per providers orders. Pt's labs and vitals were monitored throughout the day. Pt supported emotionally and encouraged to express concerns and questions. Pt educated on medications and substance abuse. Pt given a 1:1 and encouraged to focus on his personal mental and [facility].   Pt's safety ensured with 15 minute and environmental checks. Pt currently denies SI/HI and A/V hallucinations. Pt verbally agrees to seek staff if SI/HI or A/VH occurs and to consult with staff before acting on these thoughts.

## 2015-04-12 NOTE — [facility] Group Notes (Signed)
[facility] LCSW Group Therapy  [facility] 1:15 - 2:30 PM  04/12/2015   Type of Therapy:  Group Therapy  Participation Level: Active  Participation Quality:  Attentive  Affect:  Appropriate  Cognitive:  Appropriate  Insight:  Developing/Improving   Engagement in Therapy:  Developing/Improving   Modes of Intervention:  Discussion, Education, Exploration, Problem-Solving, Rapport Building, Support   Summary of Progress/Problems:   Patient was attentive to speaker from the [facility] as he shared his story of dealing with mental health/substance abuse issues and overcoming it by working a recovery program.  Patient expressed interest in their programs and services and received information on their agency.    Wynn BankerHodnett, Layton Naves Hairston 04/12/2015
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
## 2015-04-12 NOTE — Tx Team (Signed)
Initial Interdisciplinary Treatment Plan   PATIENT STRESSORS: Financial difficulties Health problems Marital or family conflict Occupational concerns   PATIENT STRENGTHS: Average or above average intelligence Capable of independent living Communication skills Supportive family/friends Work skills   PROBLEM LIST: Problem List/Patient Goals Date to be addressed Date deferred Reason deferred Estimated date of resolution  Depression with SI Anxiety      "I'm just so sad"                                                 DISCHARGE CRITERIA:  Improved stabilization in mood, thinking, and/or behavior Need for constant or close observation no longer present Reduction of life-threatening or endangering symptoms to within safe limits  PRELIMINARY DISCHARGE PLAN: Outpatient therapy Participate in family therapy Return to previous living arrangement Return to previous work or school arrangements  PATIENT/FAMIILY INVOLVEMENT: This treatment plan has been presented to and reviewed with the patient, Adam SinnerDaniel Lyons, and/or family member, .  The patient and family have been given the opportunity to ask questions and make suggestions.  [name], Ryan Palermo Jean Horne 04/12/2015, 3:22 AM

## 2015-04-12 NOTE — Progress Notes (Signed)
NUTRITION ASSESSMENT  Pt identified as at risk on the Malnutrition Screen Tool  INTERVENTION: 1. Educated patient on the importance of nutrition and encouraged intake of food and beverages. 2. Discussed weight goals. 3. Supplements: will order Ensure Enlive po BID, each supplement provides 350 kcal and 20 grams of protein  NUTRITION DIAGNOSIS: Unintentional weight loss related to sub-optimal intake as evidenced by pt report.   Goal: Pt to meet >/= 90% of their estimated nutrition needs.  Monitor:  PO intake  Assessment:  Pt seen for MST. Pt admitted for intentional overdose on psychiatric medication. Pt states he was able to eat some of breakfast this AM. He states that he has had a poor appetite for 2 months without abdominal pain or nausea. He states it has not improved since admission. 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 He states he lost 16 lbs during this time frame (7% body weight).  Pt likely not meeting needs. Will order Ensure Enlive BID to supplement. Labs and medications reviewed.  29 y.o. male  Height: Ht Readings from Last 1 Encounters:  04/11/15 5\' 11"  (1.803 m)    Weight: Wt Readings from Last 1 Encounters:  04/11/15 218 lb (98.884 kg)    Weight Hx: Wt Readings from Last 10 Encounters:  04/11/15 218 lb (98.884 kg)  08/13/13 250 lb (113.399 kg)    BMI:  Body mass index is 30.42 kg/(m^2). Pt meets criteria for obesity based on current BMI.  Estimated Nutritional Needs: Kcal: 25-30 kcal/kg Protein: > 1 gram protein/kg Fluid: 1 ml/kcal  Diet Order: Diet regular Room service appropriate?: Yes; Fluid consistency:: Thin Pt is also offered choice of unit snacks mid-morning and mid-afternoon.  Pt is eating as desired.   Lab results and medications reviewed.    Trenton GammonJessica Kamber Vignola, RD, LDN Inpatient Clinical Dietitian Pager # [phone] After hours/weekend pager #  [phone]
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
## 2015-04-12 NOTE — Progress Notes (Signed)
Patient ID: Royston SinnerDaniel Lyons, male   DOB: 1986-11-18, 29 y.o.   MRN: [id number] Adult Psychoeducational Group Note  Date:  04/12/2015 Time:  09:30am  Group Topic/Focus:  Self Care:   The focus of this group is to help patients understand the importance of self-care in order to improve or restore emotional, physical, spiritual, interpersonal, and financial health.  Participation Level:  Active  Participation Quality:  Appropriate  Affect:  Flat  Cognitive:  Alert and Oriented  Insight: Lacking  Engagement in Group:  Improving  Modes of Intervention:  Activity, Discussion, Education and Support  Additional Comments:  Pt able to identify one daily goal to accomplish today. Pt engaged in practicing positive self care techniques during group.   Adam Lyons, Adam Lyons E 04/12/2015, 10:07 AM

## 2015-04-12 NOTE — Progress Notes (Signed)
D Pt. Denies SI and HI, pt. Does have complaints of pain and discomfort.  A Writer offered support and encouragement, [facility]  R Pt. Was agitated when writer approached for assessment, but did soften and appeared more relaxed as the evening progressed. Pt. Was at times tearful, stating "I am just really sad".  Pt. Stated that he is very depressed d/t fracturing his hand on the job and being on disability, b/c it does not pay the bills.  Pt. Reports he has experienced a lot of pain since the fracture.  Pt. Was also complaining of pain when he urinates d/t having had a catheter inserted when in the ED. Pt. Was urinating often.  Abdomen was soft, nondistended.  Writer received order for  a higher dose of motrin to relieve the pain. Pt. Is presently resting quietly.   Pt. Blames everyone for his issues,  His Girlfriend, his Mother etc.  Pt. Has argued with his girlfriend multiple times this pm.

## 2015-04-13 ENCOUNTER — Encounter (HOSPITAL_COMMUNITY): Payer: Self-pay | Admitting: Psychiatry

## 2015-04-13 DIAGNOSIS — T426X2A Poisoning by other antiepileptic and sedative-hypnotic drugs, intentional self-harm, initial encounter: Secondary | ICD-10-CM

## 2015-04-13 DIAGNOSIS — T424X2A Poisoning by benzodiazepines, intentional self-harm, initial encounter: Secondary | ICD-10-CM

## 2015-04-13 DIAGNOSIS — F1220 Cannabis dependence, uncomplicated: Secondary | ICD-10-CM | POA: Diagnosis present

## 2015-04-13 DIAGNOSIS — X58XXXD Exposure to other specified factors, subsequent encounter: Secondary | ICD-10-CM

## 2015-04-13 DIAGNOSIS — F332 Major depressive disorder, recurrent severe without psychotic features: Principal | ICD-10-CM

## 2015-04-13 DIAGNOSIS — T50901A Poisoning by unspecified drugs, medicaments and biological substances, accidental (unintentional), initial encounter: Secondary | ICD-10-CM | POA: Diagnosis present

## 2015-04-13 MED ORDER — CIPROFLOXACIN HCL 500 MG PO TABS
500.0000 mg | ORAL_TABLET | Freq: Two times a day (BID) | ORAL | Status: DC
  Administered 2015-04-13 – 2015-04-14 (×2): 500 mg via ORAL
  Filled 2015-04-13 (×4): qty 1
  Filled 2015-04-13 (×3): qty 11
  Filled 2015-04-13: qty 1
  Filled 2015-04-13: qty 11
  Filled 2015-04-13: qty 1

## 2015-04-13 NOTE — Progress Notes (Signed)
D: Patient states his sleep and appetite have improved.  He denies any depressive symptoms; he denies hopelessness.  He rates his anxiety as a 2.  Patient's 72-hour request expired tomorrow so the plan is for discharge tomorrow.  Patient is concerned about his right hand and his need to return to work on Wednesday.  An appointment was made for him at [facility] on 5/17 @ 1030.  His goal today is to "learn how to take better care of myself."  He is attending groups and participating in his treatment.  He is interacting well with his peers.  He denies SI/HI/AVH. A: Continue to monitor medication management and MD orders.  Safety checks completed every 15 minutes per protocol.  Meet 1;1 with patient to discuss concerns and offer encouragement. R: Patient's behavior is appropriate to situation.

## 2015-04-13 NOTE — [facility] Group Notes (Signed)
[facility]   04/13/2015 9:57 AM    Participation Quality:  Appropraite  Mood/Affect:  Appropriate  Depression Rating:  1  Anxiety Rating:  5  Thoughts of Suicide:  No  Will you contract for safety?   NA  Current AVH:  No  Plan for Discharge/Comments:  Patient attended discharge planning group and actively participated in group. He reports being much better and hopes to discharge soon.  He will follow up with [facility] and Glendell DockerMatthew McMillan.  Suicide prevention education reviewed and SPE document provided.   Transportation Means: Patient has transportation.   Supports:  Patient has a support system.   Wynn BankerHodnett, Kelicia Youtz Hairston 04/13/2015   9:57 AM

## 2015-04-13 NOTE — Tx Team (Signed)
Interdisciplinary Treatment Plan Update (Adult)  Date:  04/13/2015  Time Reviewed:  9:59 AM   Progress in Treatment: Attending groups: Patient is attending groups. Participating in groups:  Patient engages in discussion Taking medication as prescribed:  Patient is taking medications Tolerating medication:  Patient is tolerating medications Family/Significant othe contact made:   Yes,collateral contact with finance. Patient understands diagnosis:Yes, patient understands diagnosis and need for treatment Discussing patient identified problems/goals with staff:  Yes, patient is able to express goals/problems Medical problems stabilized or resolved:  Yes Denies suicidal/homicidal ideation: Yes, patient is denying SI/HI. Issues/concerns per patient self-inventory:   Other:   Discharge Plan or Barriers:  Home with outpatient follow up  Reason for Continuation of Hospitalization: Anxiety Depression Medication stabilization  Comments:  Continue medication stabilization   Additional comments:  Patient and CSW reviewed Patient Discharge Process Letter/Patient Involvement Form.  Patient verbalized understanding and signed form.  Patient and CSW also reviewed and identified patient's goals and treatment plan.  Patient verbalized understanding and agreed to plan.  Estimated length of stay: 1 day - 72 hour request for discharge will expire  New goal(s):  Review of initial/current patient goals per problem list:  Goals met on decreased depression and follow up scheduling.   Attendees: Patient 04/13/2015 9:59 AM   Family:   04/13/2015 9:59 AM   Physician:  Chauncey Cruel. Eappen, MD 04/13/2015 9:59 AM   Nursing:    04/13/2015 9:59 AM   Clinical Social Worker:  Joette Catching, LCSW 04/13/2015 9:59 AM   Clinical Social Worker:  Erasmo Downer Drinkard, LCSW-A 04/13/2015 9:59 AM   Case Manager:  Lars Pinks, RN 04/13/2015 9:59 AM   Other:   Mayra Neer, RN 04/13/2015 9:59 AM  Other:   Janann August 04/13/2015   9:59 AM   Other:  04/13/2015 9:59 AM   Other:  04/13/2015 9:59 AM   Other:  04/13/2015 9:59 AM   Other:   04/13/2015 9:59 AM   Other:   04/13/2015 9:59 AM   Other:  04/13/2015 9:59 AM   Other:   04/13/2015 9:59 AM    Scribe for Treatment Team:   Concha Pyo, 04/13/2015   9:59 AM

## 2015-04-13 NOTE — Progress Notes (Signed)
[facility] MD Progress Note  04/13/2015 12:02 PM Adam Lyons  MRN:  [id number] Subjective: Patient states " My mind was all over the place, I could not stop it , I felt so anxious and that is why I took all those medications . I will not do that again. I feel much better now.I want to be out of here to attend a graduation. But its OK , if you feel like I need to be here another day, I am oK."  Objective: Patient is a 69 y old M, who presented with depression as well as anxiety, patient had OD ed on his medications - neurontin, klonopin.  Patient seen and chart reviewed.Discussed patient with treatment team. Patient today appears to be cheerful, pleasant , states he feels much improved than yesterday. Pt regrets his actions and is motivated to stay on his medications and get therapy. Pt today denies SI/HI/AH/VH. Pt denies ADR's of medications. Per staff patient has been active on the unit , attended [facility] yesterday, no disruptive issues noted.  Pt denies any withdrawal sx - VS reviewed - CIWA - reviewed.        Principal Problem: Major depressive disorder, recurrent severe without psychotic features Diagnosis:   Patient Active Problem List   Diagnosis Date Noted  . Cannabis use disorder, moderate, dependence [F12.20] 04/13/2015  . OD (overdose of drug) [T50.901A] 04/13/2015  . Tobacco use disorder [Z72.0] 04/09/2015  . Major depressive disorder, recurrent severe without psychotic features [F33.2] 03/16/2015   Total Time spent with patient: 30 minutes   Past Medical History:  Past Medical History  Diagnosis Date  . PTSD (post-traumatic stress disorder)   . Generalized anxiety disorder   . Chronic depression   . Bipolar disorder     Past Surgical History  Procedure Laterality Date  . Tonsillectomy    . Hand surgery Left    Family History:  Family History  Problem Relation Age of Onset  . Family history unknown: Yes   Social History:  History  Alcohol Use  . Yes   Comment: "socially"     History  Drug Use  . Yes  . Special: Marijuana    Comment: "a couple times a week"    History   Social History  . Marital Status: Single    Spouse Name: N/A  . Number of Children: N/A  . Years of Education: N/A   Social History Main Topics  . Smoking status: Current Every Day Smoker -- 1.00 packs/day    Types: Cigarettes  . Smokeless tobacco: Not on file  . Alcohol Use: Yes     Comment: "socially"  . Drug Use: Yes    Special: Marijuana     Comment: "a couple times a week"  . Sexual Activity: Not on file   Other Topics Concern  . None   Social History Narrative   Additional History:    Sleep: Fair  Appetite:  Fair     Musculoskeletal: Strength & Muscle Tone: within normal limits Gait & Station: normal Patient leans: N/A   Psychiatric Specialty Exam: Physical Exam  Review of Systems  Psychiatric/Behavioral: Positive for depression. The patient is nervous/anxious.   All other systems reviewed and are negative.   Blood pressure 125/85, pulse 91, temperature 97.8 F (36.6 C), temperature source Oral, resp. rate 18, height  (1.803 m), weight 98.884 kg (218 lb), SpO2 98 %.Body mass index is 30.42 kg/(m^2).  General Appearance: Casual  Eye Contact::  Good  Speech:  Clear  and Coherent  Volume:  Normal  Mood:  Anxious and Depressed Improving  Affect:  Congruent  Thought Process:  Coherent  Orientation:  Full (Time, Place, and Person)  Thought Content:  Rumination  Suicidal Thoughts:  No  Homicidal Thoughts:  No  Memory:  Immediate;   Fair Recent;   Fair Remote;   Fair  Judgement:  Fair  Insight:  Fair  Psychomotor Activity:  Normal  Concentration:  Fair  Recall:  Fis[employer]Fair  Fund of Knowledge:Fair  Language: Fair  Akathisia:  No  Handed:  Right  AIMS (if indicated):     Assets:  Communication Skills Desire for Improvement  ADL's:  Intact  Cognition: WNL  Sleep:  Number of Hours: 5     Current Medications: Current  Facility-Administered Medications  Medication Dose Route Frequency Provider Last Rate Last Dose  . acetaminophen (TYLENOL) tablet 650 mg  650 mg Oral Q6H PRN Adonis BrookSheila Agustin, NP   650 mg at 04/11/15 1730  . alum & mag hydroxide-simeth (MAALOX/MYLANTA) 200-200-20 MG/5ML suspension 30 mL  30 mL Oral Q4H PRN Adonis BrookSheila Agustin, NP      . DULoxetine (CYMBALTA) DR capsule 30 mg  30 mg Oral Daily Craige CottaFernando A Cobos, MD   30 mg at 04/13/15 0752  . feeding supplement (ENSURE ENLIVE) (ENSURE ENLIVE) liquid 237 mL  237 mL Oral BID BM Rockey SituFernando A Cobos, MD   237 mL at 04/13/15 1054  . gabapentin (NEURONTIN) capsule 200 mg  200 mg Oral BID Craige CottaFernando A Cobos, MD   200 mg at 04/13/15 0754  . hydrOXYzine (ATARAX/VISTARIL) tablet 25 mg  25 mg Oral Q6H PRN Craige CottaFernando A Cobos, MD      . ibuprofen (ADVIL,MOTRIN) tablet 800 mg  800 mg Oral Q8H PRN Kerry HoughSpencer E Simon, PA-C   800 mg at 04/12/15 2146  . loperamide (IMODIUM) capsule 2-4 mg  2-4 mg Oral PRN Craige CottaFernando A Cobos, MD      . LORazepam (ATIVAN) tablet 1 mg  1 mg Oral Q6H PRN Rockey SituFernando A Cobos, MD      . LORazepam (ATIVAN) tablet 1 mg  1 mg Oral TID Craige CottaFernando A Cobos, MD       Followed by  . [START ON 04/14/2015] LORazepam (ATIVAN) tablet 1 mg  1 mg Oral BID Craige CottaFernando A Cobos, MD       Followed by  . [START ON 04/16/2015] LORazepam (ATIVAN) tablet 1 mg  1 mg Oral Daily Fernando A Cobos, MD      . magnesium hydroxide (MILK OF MAGNESIA) suspension 30 mL  30 mL Oral Daily PRN Adonis BrookSheila Agustin, NP      . multivitamin with minerals tablet 1 tablet  1 tablet Oral Daily Craige CottaFernando A Cobos, MD   1 tablet at 04/13/15 0752  . nicotine (NICODERM CQ - dosed in mg/24 hours) patch 21 mg  21 mg Transdermal Daily Craige CottaFernando A Cobos, MD   21 mg at 04/13/15 0754  . ondansetron (ZOFRAN-ODT) disintegrating tablet 4 mg  4 mg Oral Q6H PRN Rockey SituFernando A Cobos, MD      . thiamine (B-1) injection 100 mg  100 mg Intramuscular Once Craige CottaFernando A Cobos, MD   100 mg at 04/12/15 2017  . thiamine (VITAMIN B-1) tablet 100  mg  100 mg Oral Daily Craige CottaFernando A Cobos, MD   100 mg at 04/13/15 0801  . traZODone (DESYREL) tablet 50 mg  50 mg Oral QHS,MR X 1 Kerry HoughSpencer E Simon, PA-C   50 mg at 04/12/15 2312  Lab Results:  Results for orders placed or performed during the hospital encounter of 04/11/15 (from the past 48 hour(s))  Urinalysis, Routine w reflex microscopic     Status: Abnormal   Collection Time: 04/12/15 11:30 AM  Result Value Ref Range   Color, Urine YELLOW YELLOW   APPearance TURBID (A) CLEAR   Specific Gravity, Urine 1.028 1.005 - 1.030   pH 7.0 5.0 - 8.0   Glucose, UA NEGATIVE NEGATIVE mg/dL   Hgb urine dipstick LARGE (A) NEGATIVE   Bilirubin Urine NEGATIVE NEGATIVE   Ketones, ur 15 (A) NEGATIVE mg/dL   Protein, ur 952100 (A) NEGATIVE mg/dL   Urobilinogen, UA 0.2 0.0 - 1.0 mg/dL   Nitrite POSITIVE (A) NEGATIVE   Leukocytes, UA MODERATE (A) NEGATIVE    Comment[facility]  Urine microscopic-add on     Status: Abnormal   Collection Time: 04/12/15 11:30 AM  Result Value Ref Range   WBC, UA 11-20 <3 WBC/hpf   RBC / HPF 11-20 <3 RBC/hpf   Bacteria, UA MANY (A) RARE   Urine-Other AMORPHOUS URATES/PHOSPHATES     Comment: [facility]    Physical Findings: AIMS: Facial and Oral Movements Muscles of Facial Expression: None, normal Lips and Perioral Area: None, normal Jaw: None, normal Tongue: None, normal,Extremity Movements Upper (arms, wrists, hands, fingers): None, normal Lower (legs, knees, ankles, toes): None, normal, Trunk Movements Neck, shoulders, hips: None, normal, Overall Severity Severity of abnormal movements (highest score from questions above): None, normal Incapacitation due to abnormal movements: None, normal Patient's awareness of abnormal movements (rate only patient's report): No Awareness, Dental Status Current problems with teeth and/or dentures?: No Does patient usually wear dentures?: No  CIWA:  CIWA-Ar  Total: 4 COWS:  COWS Total Score: 0   Assessment: Patient is a 89 y old CM with hx of depression, presented after a suicide attempt. Pt with some improvement of his sx. Will continue treatment.  Treatment Plan Summary: Daily contact with patient to assess and evaluate symptoms and progress in treatment and Medication management  Reviewed previous notes in EHR. Will continue patient on Cymbalta 30 mg po daily for affective sx. Will continue Trazodone 50 mg po qhs for sleep. Will continue Gabapentin 200 mg po bid for anxiety sx. Will continue CIWA/Ativan protocol for BZD withdrawal sx. Will continue support and encouragement. CSW will work on disposition- referral to CBT on DC. Pt encouraged to attend groups.    Medical Decision Making:  Review or order clinical lab tests (1), Review and summation of old records (2), Review of Last Therapy Session (1), Review of Medication Regimen & Side Effects (2) and Review of New Medication or Change in Dosage (2)     Jenean Escandon MD 04/13/2015, 12:02 PM

## 2015-04-13 NOTE — Progress Notes (Signed)
Recreation Therapy Notes  Date: 04/12/15 Time: 2:30pm [facility]   AAA/T Program Assumption of Risk Form signed by Patient/ or Parent Legal Guardian YES   Patient is free of allergies or sever asthma YES   Patient reports no fear of animals YES  Patient reports no history of cruelty to animals YES  Patient understands his/her participation is voluntary YES  Patient washes hands before animal contact YES  Patient washes hands after animal contact YES  Clinical Observations/Feedback: Patient did not attend.   Caroll RancherMarjette Beza [place], LRT/CTRS         Caroll RancherLindsay, Liridona Mashaw A 04/13/2015 8:44 AM

## 2015-04-13 NOTE — Progress Notes (Addendum)
D: Patient complaining of not being able to leave today, he stated that he would like to attend his younger cousin's graduation. He felt that otherwise he had a good day. He rated his day of 9/10. He is pleasant. He complained of pain in right hand. Patient was given Tylenol 650 mg PO. He also attended evening group. He gave five coping strategies for times in crisis. He stated his goal was to be able to attend his cousin's graduation and to have a good night's sleep. Patient denies SI/HI/AVH  A: Safety checks performed q 15 min on patient. Encouragement and support offered to patient to for positive coping strategies.   R: Behavior of patient appropriate

## 2015-04-13 NOTE — Progress Notes (Signed)
  [facility] :  Will you be returning to the same living situation after discharge:  Yes,  Patient will discharge home on Saturday. At discharge, do you have transportation home?: Yes,  Patient will arrange transportation home. Do you have the ability to pay for your medications: Yes,  Patient is able to obtain medications.  Release of information consent forms completed and in the chart;  Patient's signature needed at discharge.  Patient to Follow up at: Follow-up Information    Follow up with Dr. Jannifer FranklinAkintayo - [facility] On 05/03/2015.   Why:  You are scheduled with Dr. Jannifer FranklinAkintayo on Thursday, May 03, 2015 at [facility]   Contact information:   [address] [place], [place]  [id number]  [phone]      [name]  On 05/04/2015.   Why:  You are scheduled with Heron NayLeeanne [name] for counseling on Friday, May 04, 2015 at 9 AM. You will be placed on a cancellation list for an earlier appointment should one come available   Contact information:   [address] [place], [place]   [id number]  [phone]      Patient denies SI/HI: Patient no longer endorsing SI/HI or other thoughts of self harm.  Safety Planning and Suicide Prevention discussed: .Reviewed with all patients during discharge planning group   Have you used any form of tobacco in the last 30 days? (Cigarettes, Smokeless Tobacco, Cigars, and/or Pipes): Yes  Has patient been referred to the Quitline?:Patient declined referral to Quitline.   Shatonya Passon Hairston 04/13/2015, 10:02 AM

## 2015-04-13 NOTE — Progress Notes (Signed)
D Pt. Denies SI and HI, no complaints of pain or discomfort noted at present time.  A Writer offered support and encouragement, discussed pt.'s day and group attendance.  R Pt. Remains safe on the unit.  Pt. Is more accepting of care this PM than prior evening. Pt. Is in the milieu interacting instead of isolating in his room.  Pt. Is often asking for medication for pain or anxiety.

## 2015-04-13 NOTE — Progress Notes (Signed)
Recreation Therapy Notes  Date: 04/13/15 Time: 9:30am Location: [address] Group Room  Group Topic: Stress Management  Goal Area(s) Addresses:  Patient will actively participate in stress management techniques presented during session.   Intervention: Stress management techniques  Activity: Guided Imagery. LRT provided instruction and demonstration for Guided Imagery.   Education: Stress Management, Discharge Planning.   Clinical Observations/Feedback: Patient did not attend group.   Adam Lyons, LRT/CTRS         Adam Lyons A 04/13/2015 3:45 PM 

## 2015-04-14 ENCOUNTER — Encounter (HOSPITAL_COMMUNITY): Payer: Self-pay | Admitting: Registered Nurse

## 2015-04-14 MED ORDER — DULOXETINE HCL 30 MG PO CPEP: 30.0000 mg | ORAL_CAPSULE | Freq: Every day | ORAL | Status: AC

## 2015-04-14 MED ORDER — TRAZODONE HCL 50 MG PO TABS: 50.0000 mg | ORAL_TABLET | Freq: Every evening | ORAL | Status: AC | PRN

## 2015-04-14 MED ORDER — CIPROFLOXACIN HCL 500 MG PO TABS: 500.0000 mg | ORAL_TABLET | Freq: Two times a day (BID) | ORAL | Status: AC

## 2015-04-14 MED ORDER — GABAPENTIN 100 MG PO CAPS: 200.0000 mg | ORAL_CAPSULE | Freq: Two times a day (BID) | ORAL | Status: AC

## 2015-04-14 MED ORDER — HYDROXYZINE HCL 25 MG PO TABS: 25.0000 mg | ORAL_TABLET | Freq: Four times a day (QID) | ORAL | Status: AC | PRN

## 2015-04-14 NOTE — Discharge Summary (Signed)
Physician Discharge Summary Note  Patient:  Adam SinnerDaniel Lyons is an 29 y.o., male MRN:  [id number] DOB:  04-18-1986 Patient phone:  [phone] (home)  Patient address:   [address] [place] [address],  Total Time spent with patient: Greater than 30 minutes  Date of Admission:  04/11/2015 Date of Discharge: 04/14/2015  Reason for Admission:  Per H&P admission:  Patient states "I overdosed on Monday." States that the stress of being out of work "so long and my mind racing; I wasn't wanting to die. I just wanted to shut it down. I am more concerned about my job. I feel guilty about everything and blame myself for everything; that's why I have just being staying to my self and sleeping, so I won't have to think," Patient has a history of psych inpatient hospitalization 17 yrs ago when he was 12 yr; and was put on Zoloft, Xanax, Klonopin, Ativan, Lamictal; Risperidone, and Buspar. States that has never tried to kill himself "This is the first time that I actually tried to harm myself of kill myself." Lives with his girlfriend and her two children; family is supportive. At this time endorses depression "I'm just sad and worried; just don't know what is going to happen to my hand and work." Also states that he has some anxiety; "Like I said; I worry about what's going to happen with work home, family, it never stops." Patient denies homicidal ideation, psychosis, and paranoia. Patient also denies suicidal ideation at this time. History of alcohol abuse only socially now. "About 2 months ago I quit and I haven't had nothing but about 2 drinks. High school years I use mushroom, crack, and weed. My mom dated a crack head and I just wanted to try to see what it was like; luckily I was able to stop."  Principal Problem: Major depressive disorder, recurrent severe without psychotic features Discharge Diagnoses: Patient Active Problem List   Diagnosis Date Noted  . Cannabis use disorder,  moderate, dependence [F12.20] 04/13/2015  . OD (overdose of drug) [T50.901A] 04/13/2015  . Tobacco use disorder [Z72.0] 04/09/2015  . Major depressive disorder, recurrent severe without psychotic features [F33.2] 03/16/2015    Musculoskeletal: Strength & Muscle Tone: within normal limits Gait & Station: normal Patient leans: N/A  Psychiatric Specialty Exam:See Suicide Risk Assessment Physical Exam  Nursing note and vitals reviewed. Constitutional: He is oriented to person, place, and time.  Neck: Normal range of motion.  Respiratory: Effort normal.  Musculoskeletal: Normal range of motion.  Neurological: He is alert and oriented to person, place, and time.    Review of Systems  Psychiatric/Behavioral: Negative for suicidal ideas, hallucinations and memory loss. Depression: Stable. Nervous/anxious: Stable. Insomnia: Stable.   All other systems reviewed and are negative.   Blood pressure 130/78, pulse 70, temperature 97.8 F (36.6 C), temperature source Oral, resp. rate 18, height 5\' 11"  (1.803 m), weight 98.884 kg (218 lb), SpO2 98 %.Body mass index is 30.42 kg/(m^2).  Have you used any form of tobacco in the last 30 days? (Cigarettes, Smokeless Tobacco, Cigars, and/or Pipes): Yes  Has this patient used any form of tobacco in the last 30 days? (Cigarettes, Smokeless Tobacco, Cigars, and/or Pipes) Yes, A prescription for an FDA-approved tobacco cessation medication was offered at discharge and the patient refused  Past Medical History:  Past Medical History  Diagnosis Date  . PTSD (post-traumatic stress disorder)   . Generalized anxiety disorder   . Chronic depression   . Bipolar disorder  Past Surgical History  Procedure Laterality Date  . Tonsillectomy    . Hand surgery Left    Family History:  Family History  Problem Relation Age of Onset  . Family history unknown: Yes   Social History:  History  Alcohol Use  . Yes    Comment: "socially"     History  Drug Use   . Yes  . Special: Marijuana    Comment: "a couple times a week"    History   Social History  . Marital Status: Single    Spouse Name: N/A  . Number of Children: N/A  . Years of Education: N/A   Social History Main Topics  . Smoking status: Current Every Day Smoker -- 1.00 packs/day    Types: Cigarettes  . Smokeless tobacco: Not on file  . Alcohol Use: Yes     Comment: "socially"  . Drug Use: Yes    Special: Marijuana     Comment: "a couple times a week"  . Sexual Activity: Not on file   Other Topics Concern  . None   Social History Narrative    Past Psychiatric History: Hospitalizations:  Outpatient Care:  Substance Abuse Care:  Self-Mutilation:  Suicidal Attempts:  Violent Behaviors:   Risk to Self: Is patient at risk for suicide?: Yes What has been your use of drugs/alcohol within the last 12 months?: Patient reports smoking THC on ocassion Risk to Others:   Prior Inpatient Therapy:   Prior Outpatient Therapy:    Level of Care:  OP  Hospital Course:  Keno Caraway was admitted for Major depressive disorder, recurrent severe without psychotic features and crisis management.  He was treated discharged with the medications listed below under Medication List.  Medical problems were identified and treated as needed.  Home medications were restarted as appropriate.  Improvement was monitored by observation and Adam Lyons daily report of symptom reduction.  Emotional and mental status was monitored by daily self-inventory reports completed by Adam Lyons and clinical staff.         Albino Bufford was evaluated by the treatment team for stability and plans for continued recovery upon discharge.  Waddell Iten motivation was an integral factor for scheduling further treatment.  Employment, transportation, bed availability, health status, family support, and any pending legal issues were also considered during his hospital stay.  He was offered further treatment  options upon discharge including but not limited to Residential, Intensive Outpatient, and Outpatient treatment.  Clell Trahan will follow up with the services as listed below under Follow Up Information.     Upon completion of this admission the patient was both mentally and medically stable for discharge denying suicidal/homicidal ideation, auditory/visual/tactile hallucinations, delusional thoughts and paranoia.      Consults:  psychiatry  Significant Diagnostic Studies:  labs: CBC, CMET, ETOH, UDS  Discharge Vitals:   Blood pressure 130/78, pulse 70, temperature 97.8 F (36.6 C), temperature source Oral, resp. rate 18, height  (1.803 m), weight 98.884 kg (218 lb), SpO2 98 %. Body mass index is 30.42 kg/(m^2). Lab Results:   Results for orders placed or performed during the hospital encounter of 04/11/15 (from the past 72 hour(s))  Urinalysis, Routine w reflex microscopic     Status: Abnormal   Collection Time: 04/12/15 11:30 AM  Result Value Ref Range   Color, Urine YELLOW YELLOW   APPearance TURBID (A) CLEAR   Specific Gravity, Urine 1.028 1.005 - 1.030   pH 7.0 5.0 - 8.0  Glucose, UA NEGATIVE NEGATIVE mg/dL   Hgb urine dipstick LARGE (A) NEGATIVE   Bilirubin Urine NEGATIVE NEGATIVE   Ketones, ur 15 (A) NEGATIVE mg/dL   Protein, ur 161 (A) NEGATIVE mg/dL   Urobilinogen, UA 0.2 0.0 - 1.0 mg/dL   Nitrite POSITIVE (A) NEGATIVE   Leukocytes, UA MODERATE (A) NEGATIVE    Comment: Performed at [facility]  Urine microscopic-add on     Status: Abnormal   Collection Time: 04/12/15 11:30 AM  Result Value Ref Range   WBC, UA 11-20 <3 WBC/hpf   RBC / HPF 11-20 <3 RBC/hpf   Bacteria, UA MANY (A) RARE   Urine-Other AMORPHOUS URATES/PHOSPHATES     Comment: Performed at [facility]    Physical Findings: AIMS: Facial and Oral Movements Muscles of Facial Expression: None, normal Lips and Perioral Area: None, normal Jaw: None,  normal Tongue: None, normal,Extremity Movements Upper (arms, wrists, hands, fingers): None, normal Lower (legs, knees, ankles, toes): None, normal, Trunk Movements Neck, shoulders, hips: None, normal, Overall Severity Severity of abnormal movements (highest score from questions above): None, normal Incapacitation due to abnormal movements: None, normal Patient's awareness of abnormal movements (rate only patient's report): No Awareness, Dental Status Current problems with teeth and/or dentures?: No Does patient usually wear dentures?: No  CIWA:  CIWA-Ar Total: 0 COWS:  COWS Total Score: 0   See Psychiatric Specialty Exam and Suicide Risk Assessment completed by Attending Physician prior to discharge.  Discharge destination:  Home  Is patient on multiple antipsychotic therapies at discharge:  No   Has Patient had three or more failed trials of antipsychotic monotherapy by history:  No    Recommended Plan for Multiple Antipsychotic Therapies: NA      Discharge Instructions    Activity as tolerated - No restrictions    Complete by:  As directed      Diet general    Complete by:  As directed      Discharge instructions    Complete by:  As directed   Take all of you medications as prescribed by your mental healthcare provider.  Report any adverse effects and reactions from your medications to your outpatient provider promptly. Do not engage in alcohol and or illegal drug use while on prescription medicines. In the event of worsening symptoms call the crisis hotline, 911, and or go to the nearest emergency department for appropriate evaluation and treatment of symptoms. Follow-up with your primary care provider for your medical issues, concerns and or health care needs.   Keep all scheduled appointments.  If you are unable to keep an appointment call to reschedule.  Let the nurse know if you will need medications before next scheduled appointment.            Medication List     STOP taking these medications        ALPRAZolam 0.5 MG tablet  Commonly known as:  XANAX     busPIRone 15 MG tablet  Commonly known as:  BUSPAR     clonazePAM 1 MG tablet  Commonly known as:  KLONOPIN     [employer] NTS STEP 1 21 mg/24hr patch  Generic drug:  nicotine     diazepam 5 MG tablet  Commonly known as:  VALIUM     HYDROcodone-acetaminophen 5-325 MG per tablet  Commonly known as:  NORCO/VICODIN     HYDROcodone-acetaminophen 7.5-325 MG per tablet  Commonly known as:  NORCO     ketoconazole 200 MG  tablet  Commonly known as:  NIZORAL     lamoTRIgine 150 MG tablet  Commonly known as:  LAMICTAL     oxyCODONE-acetaminophen 5-325 MG per tablet  Commonly known as:  PERCOCET/ROXICET     risperiDONE 1 MG tablet  Commonly known as:  RISPERDAL      TAKE these medications      Indication   ciprofloxacin 500 MG tablet  Commonly known as:  CIPRO  Take 1 tablet (500 mg total) by mouth 2 (two) times daily.   Indication:  Infection of Urinary Tract with Complications     doxycycline 100 MG capsule  Commonly known as:  VIBRAMYCIN      DULoxetine 30 MG capsule  Commonly known as:  CYMBALTA  Take 1 capsule (30 mg total) by mouth daily.   Indication:  Major Depressive Disorder     gabapentin 100 MG capsule  Commonly known as:  NEURONTIN  Take 2 capsules (200 mg total) by mouth 2 (two) times daily.   Indication:  Agitation, Alcohol Withdrawal Syndrome, Trouble Sleeping, anxiety     hydrOXYzine 25 MG tablet  Commonly known as:  ATARAX/VISTARIL  Take 1 tablet (25 mg total) by mouth every 6 (six) hours as needed (anxiety/agitation).   Indication:  anxiety     naproxen 500 MG tablet  Commonly known as:  NAPROSYN      traMADol 50 MG tablet  Commonly known as:  ULTRAM      traZODone 50 MG tablet  Commonly known as:  DESYREL  Take 1 tablet (50 mg total) by mouth at bedtime as needed for sleep.   Indication:  Trouble Sleeping       Follow-up Information    Follow up  with Dr. Jannifer [facility]Akintayo - [facility] On 05/03/2015.   Why:  You are scheduled with Dr. Jannifer FranklinAkintayo on Thursday, May 03, 2015 at [facility]   Contact information:   [address] [place], [place]  [address]  [phone]      Follow [name]  On 05/04/2015.   Why:  You are scheduled with Heron NayLeeanne [name] for counseling on Friday, May 04, 2015 at 9 AM. You will be placed on a cancellation list for an earlier appointment should one come available   Contact information:   [address] [place], [place]   [address]  [phone]      Follow up with Dr. Ophelia CharterYates [facility] On 04/17/2015.   Why:  Tuesday, Apr 17, 2015 at 10:30 AM   Contact information:   [address] [place], [place]   [address]  [phone]      Follow-up recommendations:  Activity:  As tolerated Diet:  As tolerated  Comments:   Patient has been instructed to take medications as prescribed; and report adverse effects to outpatient provider.  Follow up with primary doctor for any medical issues and If symptoms recur report to nearest emergency or crisis hot line.    Total Discharge Time: Greater than 30 minutes  Signed: Rankin, Shuvon,FNP-BC 04/14/2015, 10:28 AM  I have examined the patient and agree with the discharge plan and findings. I have also done suicide assessment on this patient.

## 2015-04-14 NOTE — Plan of Care (Signed)
Problem: Ineffective individual coping Goal: STG: Patient will remain free from self harm Outcome: Completed/Met Date Met:  04/14/15 Patient denies any SI upon discharge.

## 2015-04-14 NOTE — Progress Notes (Signed)
Discharge note:  Patient discharged home per MD order.  Patient received all personal belongings, prescriptions and medication samples.  He denies SI/HI/AVH.  Reviewed discharge instructions and medications and patient indicated understanding.  He is aware of all follow up appts.  Patient left ambulatory with girlfriend.

## 2015-04-14 NOTE — [facility] Suicide Risk Assessment (Signed)
[facility]   Demographic Factors:  Male  Total Time spent with patient: 30 minutes  Musculoskeletal: Strength & Muscle Tone: within normal limits Gait & Station: normal Patient leans: no lean  Psychiatric Specialty Exam: Physical Exam  Constitutional: He appears well-developed and well-nourished.  Skin: He is not diaphoretic.    Review of Systems  Constitutional: Negative.   Cardiovascular: Negative for chest pain.  Skin: Negative for rash.  Psychiatric/Behavioral: Negative for depression and suicidal ideas.    Blood pressure 130/78, pulse 70, temperature 97.8 F (36.6 C), temperature source Oral, resp. rate 18, height 5\' 11"  (1.803 m), weight 98.884 kg (218 lb), SpO2 98 %.Body mass index is 30.42 kg/(m^2).  General Appearance: Casual  Eye Contact::  Fair  Speech:  Slow409  Volume:  Normal  Mood:  Euthymic  Affect:  Constricted  Thought Process:  Coherent  Orientation:  Full (Time, Place, and Person)  Thought Content:  coherent and no psychosis  Suicidal Thoughts:  No  Homicidal Thoughts:  No  Memory:  Immediate;   Fair Recent;   Fair  Judgement:  Fair  Insight:  Fair  Psychomotor Activity:  Normal  Concentration:  Fair  Recall:  F[employer]Fair  Fund of Knowledge:Fair  Language: Fair  Akathisia:  Negative  Handed:  Right  AIMS (if indicated):     Assets:  Communication Skills Desire for Improvement  Sleep:  Number of Hours: 5  Cognition: WNL  ADL's:  Intact   Have you used any form of tobacco in the last 30 days? (Cigarettes, Smokeless Tobacco, Cigars, and/or Pipes): Yes  Has this patient used any form of tobacco in the last 30 days? (Cigarettes, Smokeless Tobacco, Cigars, and/or Pipes) N/A  Mental Status Per Nursing Assessment::   On Admission:  NA (Pt denies suicidal ideation at time of admission)  Current Mental Status by Physician: see MSE above  Loss Factors: Loss of significant relationship and substance use  Historical  Factors: Impulsivity  Risk Reduction Factors:   Positive coping skills or problem solving skills  Continued Clinical Symptoms:  Dysthymia  Cognitive Features That Contribute To Risk:  None    Suicide Risk:  Minimal: No identifiable suicidal ideation.  Patients presenting with no risk factors but with morbid ruminations; may be classified as minimal risk based on the severity of the depressive symptoms  Principal Problem: Major depressive disorder, recurrent severe without psychotic features Discharge Diagnoses:  Patient Active Problem List   Diagnosis Date Noted  . Cannabis use disorder, moderate, dependence [F12.20] 04/13/2015  . OD (overdose of drug) [T50.901A] 04/13/2015  . Tobacco use disorder [Z72.0] 04/09/2015  . Major depressive disorder, recurrent severe without psychotic features [F33.2] 03/16/2015    Follow-up Information    Follow up with Dr. Jannifer FranklinAkintayo - [facility] On 05/03/2015.   Why:  You are scheduled with Dr. Jannifer FranklinAkintayo on Thursday, May 03, 2015 at [facility]1PM   Contact information:   [address] [place], [place]  [address]  [phone]      Follow up with [facility]anne [name]  On 05/04/2015.   Why:  You are scheduled with Heron NayLeeanne [name] for counseling on Friday, May 04, 2015 at 9 AM. You will be placed on a cancellation list for an earlier appointment should one come available   Contact information:   [address] [place]   [address]  [phone]      Follow up with Dr. Ophelia CharterYates [facility] On 04/17/2015.  Why:  Tuesday, Apr 17, 2015 at 10:30 AM   Contact information:   [address] [place]   [address]  [phone]      Plan Of Care/Follow-up recommendations:  Activity:  as tolerated Diet:  regular See Discharge summary and above appointments to make follow up. Compliance stressed. Is patient on multiple antipsychotic therapies at discharge:  No   Has Patient had three or more  failed trials of antipsychotic monotherapy by history:  No  Recommended Plan for Multiple Antipsychotic Therapies: NA    Trevontae Lindahl 04/14/2015, 12:17 PM

## 2015-04-18 ENCOUNTER — Telehealth (HOSPITAL_COMMUNITY): Payer: Self-pay | Admitting: Psychiatry

## 2015-05-04 ENCOUNTER — Ambulatory Visit (INDEPENDENT_AMBULATORY_CARE_PROVIDER_SITE_OTHER): Payer: PRIVATE HEALTH INSURANCE | Admitting: Psychology

## 2015-05-04 ENCOUNTER — Encounter (HOSPITAL_COMMUNITY): Payer: Self-pay | Admitting: Psychology

## 2015-05-04 DIAGNOSIS — F419 Anxiety disorder, unspecified: Secondary | ICD-10-CM | POA: Diagnosis not present

## 2015-05-04 DIAGNOSIS — F4310 Post-traumatic stress disorder, unspecified: Secondary | ICD-10-CM | POA: Diagnosis not present
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
## 2015-05-31 ENCOUNTER — Ambulatory Visit (HOSPITAL_COMMUNITY): Payer: Self-pay | Admitting: Psychology

## 2015-05-31 ENCOUNTER — Encounter (HOSPITAL_COMMUNITY): Payer: Self-pay | Admitting: Psychology

## 2015-05-31 NOTE — Progress Notes (Signed)
Adam SinnerDaniel Lyons is a 29 y.o. male patient who didn't show for today's appointment.  Letter sent.        Adam RadonYATES,Adam Lyons, LPC

## 2015-07-03 ENCOUNTER — Encounter (HOSPITAL_COMMUNITY): Payer: Self-pay | Admitting: Psychology

## 2015-07-28 ENCOUNTER — Encounter (HOSPITAL_COMMUNITY): Payer: Self-pay | Admitting: Emergency Medicine

## 2015-07-28 ENCOUNTER — Emergency Department (INDEPENDENT_AMBULATORY_CARE_PROVIDER_SITE_OTHER)
Admission: EM | Admit: 2015-07-28 | Discharge: 2015-07-28 | Disposition: A | Discharge status: Nursing Home (Includes ICF) | Payer: BLUE CROSS/BLUE SHIELD | Source: Home / Self Care

## 2015-07-28 ENCOUNTER — Emergency Department (HOSPITAL_COMMUNITY)
Admission: EM | Admit: 2015-07-28 | Discharge: 2015-07-28 | Disposition: A | Discharge status: Home / Self Care | Payer: BLUE CROSS/BLUE SHIELD | Attending: Emergency Medicine | Admitting: Emergency Medicine

## 2015-07-28 ENCOUNTER — Emergency Department (HOSPITAL_COMMUNITY): Payer: BLUE CROSS/BLUE SHIELD

## 2015-07-28 ENCOUNTER — Encounter (HOSPITAL_COMMUNITY): Payer: Self-pay

## 2015-07-28 DIAGNOSIS — Z792 Long term (current) use of antibiotics: Secondary | ICD-10-CM | POA: Diagnosis not present

## 2015-07-28 DIAGNOSIS — Z79899 Other long term (current) drug therapy: Secondary | ICD-10-CM | POA: Insufficient documentation

## 2015-07-28 DIAGNOSIS — R1032 Left lower quadrant pain: Secondary | ICD-10-CM | POA: Diagnosis not present

## 2015-07-28 DIAGNOSIS — Z8781 Personal history of (healed) traumatic fracture: Secondary | ICD-10-CM | POA: Insufficient documentation

## 2015-07-28 DIAGNOSIS — F419 Anxiety disorder, unspecified: Secondary | ICD-10-CM | POA: Insufficient documentation

## 2015-07-28 DIAGNOSIS — N50819 Testicular pain, unspecified: Secondary | ICD-10-CM

## 2015-07-28 DIAGNOSIS — Z720 Tobacco use: Secondary | ICD-10-CM | POA: Insufficient documentation

## 2015-07-28 DIAGNOSIS — N508 Other specified disorders of male genital organs: Secondary | ICD-10-CM | POA: Diagnosis not present

## 2015-07-28 DIAGNOSIS — N50812 Left testicular pain: Secondary | ICD-10-CM

## 2015-07-28 DIAGNOSIS — F319 Bipolar disorder, unspecified: Secondary | ICD-10-CM | POA: Diagnosis not present

## 2015-07-28 DIAGNOSIS — N410 Acute prostatitis: Secondary | ICD-10-CM | POA: Diagnosis not present

## 2015-07-28 LAB — POCT URINALYSIS DIP (DEVICE)
Bilirubin Urine: NEGATIVE
GLUCOSE, UA: NEGATIVE mg/dL
Hgb urine dipstick: NEGATIVE
KETONES UR: NEGATIVE mg/dL
LEUKOCYTES UA: NEGATIVE
Nitrite: NEGATIVE
Protein, ur: NEGATIVE mg/dL
Specific Gravity, Urine: 1.025 (ref 1.005–1.030)
Urobilinogen, UA: 0.2 mg/dL (ref 0.0–1.0)
pH: 6.5 (ref 5.0–8.0)

## 2015-07-28 MED ORDER — CEFTRIAXONE SODIUM 250 MG IJ SOLR
250.0000 mg | Freq: Once | INTRAMUSCULAR | Status: AC
  Administered 2015-07-28: 250 mg via INTRAMUSCULAR
  Filled 2015-07-28: qty 250

## 2015-07-28 MED ORDER — DOXYCYCLINE HYCLATE 100 MG PO TABS: 100.0000 mg | ORAL_TABLET | Freq: Two times a day (BID) | ORAL | Status: AC

## 2015-07-28 MED ORDER — DOXYCYCLINE HYCLATE 100 MG PO TABS
100.0000 mg | ORAL_TABLET | Freq: Once | ORAL | Status: AC
  Administered 2015-07-28: 100 mg via ORAL
  Filled 2015-07-28: qty 1

## 2015-07-28 MED ORDER — LIDOCAINE HCL (PF) 1 % IJ SOLN
2.0000 mL | Freq: Once | INTRAMUSCULAR | Status: AC
  Administered 2015-07-28: 2 mL
  Filled 2015-07-28: qty 5

## 2015-07-28 NOTE — ED Notes (Signed)
Pt. Left with all belongings and refused wheelchair. Discharge instructions were reviewed and all questions were answered.  

## 2015-07-28 NOTE — ED Notes (Addendum)
Pt states that pain radiates from the left side of his scrotum to his left lower abdomen and describes the pain as sharp.   Pt is concerned that his symptoms may be related to his digestive problems. Pt has not been diagnosed with any specific digestive problem. Pt states that he has been having problems urinating for about a year.   Pt states that every time he eats his abdomen cramps up.

## 2015-07-28 NOTE — ED Notes (Signed)
Pt c/o left teste pain associated w/abd pain onset 1 week Also reports difficulty voiding urine and urinary freq Denies fevers, chills, n/v/d Alert... No acute distress.

## 2015-07-28 NOTE — ED Notes (Signed)
Went to room pt. Pt in ultrasound and will be returned to room when finished.

## 2015-07-28 NOTE — ED Notes (Signed)
Pt from urgent care for left testicle pain for a week now. Hurts more when he needs to have a BM. No discoloration. Tender to palpate. Ongoing difficulty with urination. The pain occasionally goes away but nothing really relieves it.

## 2015-07-28 NOTE — ED Provider Notes (Signed)
CSN: [id number]     Arrival date & time 07/28/15  1819 History   None    Chief Complaint  Patient presents with  . Testicle Pain   (Consider location/radiation/quality/duration/timing/severity/associated sxs/prior Treatment) Patient is a 29 y.o. male presenting with testicular pain and abdominal pain. The history is provided by the patient. No language interpreter was used.  Testicle Pain This is a new (pain in left testicle) problem. The current episode started more than 1 week ago. The problem occurs constantly (Intermittent but constant in the last 2 days). The problem has been gradually worsening. Associated symptoms include abdominal pain. Pertinent negatives include no chest pain, no headaches and no shortness of breath. Associated symptoms comments: Feel like something is drawing his left testes up like a string.. Exacerbated by: touching. Nothing relieves the symptoms.  Abdominal Pain Pain location:  LLQ Pain quality: aching   Pain radiates to:  Does not radiate Pain severity:  Moderate Onset quality:  Gradual Duration: Going on for about 5 years but worsened recently. Timing:  Intermittent Progression:  Waxing and waning Chronicity:  Chronic Context: diet changes   Context: not alcohol use   Context comment:  At times lactose worsens his symptoms Relieved by:  Nothing Worsened by:  NSAIDs Associated symptoms: hematochezia   Associated symptoms: no anorexia, no chest pain, no constipation, no diarrhea, no dysuria, no fever, no nausea and no shortness of breath   No dysuria, no nausea or vomiting. He has gastric issue 5-6 years ago, he is on [employer], he is uncertain if this is related to his symptoms. He denies blood in his urine. No fever. No penile discharge.  Past Medical History  Diagnosis Date  . PTSD (post-traumatic stress disorder)   . Generalized anxiety disorder   . Chronic depression   . Bipolar disorder   . Anxiety   . Hand fracture, right 02/20/15   Past  Surgical History  Procedure Laterality Date  . Tonsillectomy    . Hand surgery Right    Family History  Problem Relation Age of Onset  . OCD Mother   . Anxiety disorder Mother   . Depression Father   . Anxiety disorder Father   . Alcohol abuse Father   . Alcohol abuse Brother   . Suicidality Maternal Grandfather    Social History  Substance Use Topics  . Smoking status: Current Every Day Smoker -- 2.00 packs/day    Types: Cigarettes  . Smokeless tobacco: None  . Alcohol Use: Yes     Comment: "socially"    Review of Systems  Constitutional: Negative for fever.  Respiratory: Negative.  Negative for shortness of breath.   Cardiovascular: Negative.  Negative for chest pain.  Gastrointestinal: Positive for abdominal pain and hematochezia. Negative for nausea, diarrhea, constipation and anorexia.  Genitourinary: Positive for testicular pain. Negative for dysuria and penile swelling.  Neurological: Negative for headaches.  All other systems reviewed and are negative.   Allergies  Review of patient's allergies indicates no known allergies.  Home Medications   Prior to Admission medications   Medication Sig Start Date End Date Taking? Authorizing Provider  busPIRone (BUSPAR) 10 MG tablet Take 15 mg by mouth 3 (three) times daily.    Yes Mojeed Akintayo  clonazePAM (KLONOPIN) 1 MG tablet Take 1 mg by mouth 2 (two) times daily.   Yes Historical Provider, [place]  gabapentin (NEURONTIN) 100 MG capsule Take 2 capsules (200 mg total) by mouth 2 (two) times daily. 04/14/15  Yes Shuvon  B Rankin, NP  busPIRone (BUSPAR) 15 MG tablet Take 15 mg by mouth 3 (three) times daily.    Historical Provider, [place]  doxycycline (VIBRAMYCIN) 100 MG capsule  01/17/15   Historical Provider, [place]  DULoxetine (CYMBALTA) 30 MG capsule Take 1 capsule (30 mg total) by mouth daily. Patient not taking: Reported on 05/04/2015 04/14/15   Shuvon B Rankin, NP  hydrOXYzine (ATARAX/VISTARIL) 25 MG tablet Take 1 tablet (25 mg  total) by mouth every 6 (six) hours as needed (anxiety/agitation). 04/14/15   Shuvon B Rankin, NP  naproxen (NAPROSYN) 500 MG tablet  02/20/15   Historical Provider, [place]  traMADol Janean Sark) 50 MG tablet  03/02/15   Historical Provider, [place]  traZODone (DESYREL) 50 MG tablet Take 1 tablet (50 mg total) by mouth at bedtime as needed for sleep. 04/14/15   Shuvon B Rankin, NP   Meds Ordered and Administered this Visit  Medications - No data to display  BP 149/78 mmHg  Pulse 68  Temp(Src) 98.4 F (36.9 C) (Oral)  Resp 16  SpO2 98% No data found.   Physical Exam  Constitutional: He appears well-developed. No distress.  Cardiovascular: Normal rate, regular rhythm and normal heart sounds.   No murmur heard. Pulmonary/Chest: Effort normal and breath sounds normal. No respiratory distress. He has no wheezes.  Abdominal: Soft. Bowel sounds are normal. He exhibits no distension. There is no hepatosplenomegaly. There is tenderness in the left lower quadrant. There is no rigidity, no rebound and negative Murphy's sign. Hernia confirmed negative in the right inguinal area and confirmed negative in the left inguinal area.  Genitourinary:    Right testis shows no mass and no swelling. Left testis shows tenderness. Left testis shows no mass and no swelling.  Nursing note and vitals reviewed.   ED Course  Procedures (including critical care time)  Labs Review Labs Reviewed - No data to display  Imaging Review No results found.   Visual Acuity Review  Right Eye Distance:   Left Eye Distance:   Bilateral Distance:    Right Eye Near:   Left Eye Near:    Bilateral Near:         MDM  No diagnosis found. Testicular pain, left  Abdominal pain, left lower quadrant  1. ?? Appendiceal testicular torsion with high riding testes. i recommended ED evaluation with an ultrasound. Patient transferred to the ED. 2. LLQ abdominal pain is chronic, he had been treated in the past for IBS, ??  Diverticulitis due to hx of blood in the stool recently. I recommended CT abdomen which he can get at the ED. He agreed with plan. Transfer to the ED.    Doreene Eland, MD 07/28/15 1945

## 2015-07-28 NOTE — ED Notes (Signed)
Pt in ultrasound

## 2015-07-28 NOTE — ED Notes (Signed)
Urine was obtained from pt at 2234 and sent down (by this writer) to main lab for a GC/chlamydia urine test (per Dr.Carr). Main lab was called at 2340 to check results per dr. request. Main lab answered stating she was third shift and knew nothing about the test and couldn't find the urine anywhere. Dr. Lafayette Dragon and Nurse notified.

## 2015-07-28 NOTE — ED Provider Notes (Signed)
CSN: [id number]     Arrival date & time 07/28/15  1946 History   First MD Initiated Contact with Patient 07/28/15 2108     Chief Complaint  Patient presents with  . Testicle Pain     (Consider location/radiation/quality/duration/timing/severity/associated sxs/prior Treatment) Patient is a 29 y.o. male presenting with testicular pain.  Testicle Pain This is a new problem. The current episode started in the past 7 days. The problem occurs constantly. The problem has been unchanged. Associated symptoms include abdominal pain and urinary symptoms. Pertinent negatives include no arthralgias, chest pain, chills, congestion, fever, headaches, myalgias, nausea, sore throat or vomiting. Nothing aggravates the symptoms. He has tried nothing for the symptoms.    Past Medical History  Diagnosis Date  . PTSD (post-traumatic stress disorder)   . Generalized anxiety disorder   . Chronic depression   . Bipolar disorder   . Anxiety   . Hand fracture, right 02/20/15   Past Surgical History  Procedure Laterality Date  . Tonsillectomy    . Hand surgery Right    Family History  Problem Relation Age of Onset  . OCD Mother   . Anxiety disorder Mother   . Depression Father   . Anxiety disorder Father   . Alcohol abuse Father   . Alcohol abuse Brother   . Suicidality Maternal Grandfather    Social History  Substance Use Topics  . Smoking status: Current Every Day Smoker -- 2.00 packs/day    Types: Cigarettes  . Smokeless tobacco: None  . Alcohol Use: Yes     Comment: "socially"    Review of Systems  Constitutional: Negative for fever and chills.  HENT: Negative for congestion and sore throat.   Eyes: Negative for visual disturbance.  Respiratory: Negative for shortness of breath and wheezing.   Cardiovascular: Negative for chest pain.  Gastrointestinal: Positive for abdominal pain. Negative for nausea, vomiting, diarrhea and constipation.  Genitourinary: Positive for testicular pain.  Negative for dysuria, hematuria, discharge, penile swelling, scrotal swelling, difficulty urinating and penile pain.  Musculoskeletal: Negative for myalgias and arthralgias.  Skin: Negative for wound.  Neurological: Negative for syncope and headaches.  Psychiatric/Behavioral: Negative for behavioral problems.  All other systems reviewed and are negative.     Allergies  Review of patient's allergies indicates no known allergies.  Home Medications   Prior to Admission medications   Medication Sig Start Date End Date Taking? Authorizing Provider  busPIRone (BUSPAR) 10 MG tablet Take 15 mg by mouth 3 (three) times daily.     Mojeed Akintayo  busPIRone (BUSPAR) 15 MG tablet Take 15 mg by mouth 3 (three) times daily.    Historical Provider, MD  clonazePAM (KLONOPIN) 1 MG tablet Take 1 mg by mouth 2 (two) times daily.    Historical Provider, MD  doxycycline (VIBRA-TABS) 100 MG tablet Take 1 tablet (100 mg total) by mouth 2 (two) times daily. 07/28/15 08/11/15  Beverely Risen, MD  DULoxetine (CYMBALTA) 30 MG capsule Take 1 capsule (30 mg total) by mouth daily. Patient not taking: Reported on 05/04/2015 04/14/15   Shuvon B Rankin, NP  gabapentin (NEURONTIN) 100 MG capsule Take 2 capsules (200 mg total) by mouth 2 (two) times daily. 04/14/15   Shuvon B Rankin, NP  hydrOXYzine (ATARAX/VISTARIL) 25 MG tablet Take 1 tablet (25 mg total) by mouth every 6 (six) hours as needed (anxiety/agitation). 04/14/15   Shuvon B Rankin, NP  naproxen (NAPROSYN) 500 MG tablet  02/20/15   Historical Provider, MD  traMADol Janean Sark) 50  MG tablet  03/02/15   Historical Provider, MD  traZODone (DESYREL) 50 MG tablet Take 1 tablet (50 mg total) by mouth at bedtime as needed for sleep. 04/14/15   Shuvon B Rankin, NP   BP 147/79 mmHg  Pulse 54  Temp(Src) 98.1 F (36.7 C) (Oral)  Resp 23  Ht  (1.803 m)  Wt 215 lb (97.523 kg)  BMI 30.00 kg/m2  SpO2 99% Physical Exam  Constitutional: He is oriented to person, place, and  time. He appears well-developed and well-nourished.  HENT:  Head: Normocephalic and atraumatic.  Eyes: EOM are normal.  Neck: Normal range of motion.  Cardiovascular: Normal rate, regular rhythm and normal heart sounds.   No murmur heard. Pulmonary/Chest: Effort normal and breath sounds normal. No respiratory distress.  Abdominal: Soft. There is no tenderness.  Genitourinary: Penis normal. Prostate is tender. Left testis shows tenderness. Left testis shows no swelling.  Musculoskeletal: He exhibits no edema.  Neurological: He is alert and oriented to person, place, and time.  Skin: No rash noted. He is not diaphoretic.    ED Course  Procedures (including critical care time) Labs Review Labs Reviewed  RPR  GC/CHLAMYDIA PROBE AMP ([place]) NOT AT [facility]    Imaging Review US Scrotum  07/28/2015   CLINICAL DATA:  Left lower quadrant pain and left testicular pain. Some low back and left flank pain. Evaluate for testicular torsion.  EXAM: SCROTAL ULTRASOUND  DOPPLER ULTRASOUND OF THE TESTICLES  TECHNIQUE: Complete ultrasound examination of the testicles, epididymis, and other scrotal structures was performed. Color and spectral Doppler ultrasound were also utilized to evaluate blood flow to the testicles.  COMPARISON:  None.  FINDINGS: Right testicle  Measurements: 2.6 x 2.9 x 5.2 cm. No mass or microlithiasis visualized.  Left testicle  Measurements: 2.9 x 3.6 x 4.8 cm. No mass or microlithiasis visualized.  Right epididymis:  Normal in size and appearance.  Left epididymis:  Normal in size and appearance.  Hydrocele:  None visualized.  Varicocele:  None visualized.  Pulsed Doppler interrogation of both testes demonstrates normal low resistance arterial and venous waveforms bilaterally.  IMPRESSION: Normal testicular ultrasound.  No evidence of torsion.   Electronically Signed   By: Elberta Fortis M.D.   On: 07/28/2015 21:16   [place] Art/ven Flow Abd Pelv Doppler  07/28/2015   CLINICAL DATA:   Left lower quadrant pain and left testicular pain. Some low back and left flank pain. Evaluate for testicular torsion.  EXAM: SCROTAL ULTRASOUND  DOPPLER ULTRASOUND OF THE TESTICLES  TECHNIQUE: Complete ultrasound examination of the testicles, epididymis, and other scrotal structures was performed. Color and spectral Doppler ultrasound were also utilized to evaluate blood flow to the testicles.  COMPARISON:  None.  FINDINGS: Right testicle  Measurements: 2.6 x 2.9 x 5.2 cm. No mass or microlithiasis visualized.  Left testicle  Measurements: 2.9 x 3.6 x 4.8 cm. No mass or microlithiasis visualized.  Right epididymis:  Normal in size and appearance.  Left epididymis:  Normal in size and appearance.  Hydrocele:  None visualized.  Varicocele:  None visualized.  Pulsed Doppler interrogation of both testes demonstrates normal low resistance arterial and venous waveforms bilaterally.  IMPRESSION: Normal testicular ultrasound.  No evidence of torsion.   Electronically Signed   By: Elberta Fortis M.D.   On: 07/28/2015 21:16   I have personally reviewed and evaluated these images and lab results as part of my medical decision-making.   EKG Interpretation None  MDM   Final diagnoses:  Acute prostatitis     Patient is a 29 year old male that presents with several days of left testicular pain and left lower quadrant pain. Patient feels this pain is connected. Patient denies any penile discharge however does engage in anal sex. Patient denies any history of STD. Patient does not use protection. Patient was seen in urgent care and a UA was performed which was unremarkable. There was concern for possible torsion so he was sent here for ultrasound. Patient ultrasound was completely normal. On exam patient had significant left epididymis tenderness and prostate tenderness. We will treat for prostatitis and tests for GC, chlamydia, syphilis. Patient given ceftriaxone and doxycycline. Patient advised on safe sex  practices.    Beverely Risen, MD 07/28/15 1610  Margarita Grizzle, MD 07/28/15 9604

## 2015-07-28 NOTE — Discharge Instructions (Signed)
It was nice seeing you today. I am sorry about your testicular pain. Although this has been on going for 1 wk and likelihood for torsion is low, but I will recommend an ultrasound which you can get at the ED. They can also assess your stomach pain. Your urine test is negative for infection. Take Ibuprofen prn pain   Testicular Self-Exam A self-exam of your testicles is looking at and feeling your testicles for abnormal lumps or swelling. Several things can cause swelling, lumps, or pain in your testicles. Some of these causes are:  Injuries.  Puffiness, redness, and soreness (inflammation).  Infection.  Extra fluids around your testicle.  Twisted testicles.  Testicular cancer. The testicles are easiest to check after warm baths or showers. They are harder to examine when you are cold.  Follow these steps while you are standing:  Hold your penis away from your body.  Roll one testicle between your thumb and finger. Feel the entire testicle.  Roll the other testicle between your thumb and finger. Feel the entire testicle. Feel for lumps, swelling, or discomfort. A normal testicle is egg shaped. It feels firm. It is smooth and not tender. The spermatic cord feels like a firm spaghetti-like cord. It is at the back of your testicle. Examine the crease between the front of your leg and your abdomen. Feel for any bumps that are tender. These could be enlarged lymph nodes.  Document Released: 02/13/2009 Document Revised: 09/07/2013 Document Reviewed: 05/09/2013 [facility] Patient Information 2015 [place], [place]. This information is not intended to replace advice given to you by your health care provider. Make sure you discuss any questions you have with your health care provider.

## 2015-07-28 NOTE — Discharge Instructions (Signed)
Prostatitis °Prostatitis is redness, soreness, and puffiness (swelling) of the prostate gland. The prostate gland is the walnut-sized gland located just below your bladder. °HOME CARE:  °· Take all medicines as told by your doctor. °· Take warm-water baths (sitz baths) as told by your doctor. °GET HELP IF: °· Your symptoms get worse, not better. °· You have a fever. °GET HELP RIGHT AWAY IF:  °· You have chills. °· You feel sick to your stomach (nauseous) or like you will throw up (vomit). °· You feel lightheaded or like you will pass out (faint). °· You are unable to pee (urinate). °· You have blood or blood clumps (clots) in your pee (urine). °MAKE SURE YOU: °· Understand these instructions. °· Will watch your condition. °· Will get help right away if you are not doing well or get worse. °Document Released: 05/18/2012 Document Revised: 07/20/2013 Document Reviewed: 06/06/2013 °[employer]® Patient Information ©2015 [employer]. This information is not intended to replace advice given to you by your health care provider. Make sure you discuss any questions you have with your health care provider. ° °

## 2015-07-29 LAB — RPR: RPR Ser Ql: NONREACTIVE

## 2015-07-30 ENCOUNTER — Ambulatory Visit (INDEPENDENT_AMBULATORY_CARE_PROVIDER_SITE_OTHER): Payer: PRIVATE HEALTH INSURANCE | Admitting: Psychology

## 2015-07-30 ENCOUNTER — Encounter (HOSPITAL_COMMUNITY): Payer: Self-pay | Admitting: Psychology

## 2015-07-30 DIAGNOSIS — F419 Anxiety disorder, unspecified: Secondary | ICD-10-CM | POA: Diagnosis not present

## 2015-07-30 DIAGNOSIS — F4310 Post-traumatic stress disorder, unspecified: Secondary | ICD-10-CM | POA: Diagnosis not present

## 2015-07-30 LAB — GC/CHLAMYDIA PROBE AMP (~~LOC~~) NOT AT ARMC
Chlamydia: NEGATIVE
Neisseria Gonorrhea: NEGATIVE

## 2015-07-30 NOTE — Progress Notes (Signed)
   THERAPIST PROGRESS NOTE  Session Time: 11.09am-11.59am  Participation Level: Active  Behavioral Response: Well GroomedAlertaffect WNL  Type of Therapy: Individual Therapy  Treatment Goals addressed: Diagnosis: PTSD, Anxiety and goal 1.  Interventions: CBT, Supportive and Other: Setting Boundaries  Summary: Adam Lyons is a 29 y.o. male who presents with report of feeling less anxious, less depressed and less panic attacks.  Pt reported that he returned to work on 06/13/15 and better to have this structure. Pt reported also that his girlfriend moved out 4 weeks ago and he feels that this has been beneficial as major stressor removed.  Pt reported that still talk and he is giving her time to get settled and not all stuff out.  Pt also reports that they still do things together and sexually active but that he is clear that not getting back together.  Pt reports that he feels that he is keeping boundaries and not engaging in her attempts to create conflict.  Pt acknowledges that to move forward will need to have more boundaries.  Pt discussed his goals for tx.  Pt discussed finances as major stressors as 60,000 in debt that is not car or home. 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   Suicidal/Homicidal: Nowithout intent/plan  Therapist Response: Assessed pt current functioning per pt report.  Processed w/pt mood and contributing factors towards improvement.  Explored interactions w/ ex and discussed healthy boundaries and need for these to work towards his wants for future.    Plan: Return again in 2-3 weeks.  Diagnosis: PTSD, Anxiety D/O    [name],LEANNE, LPC 07/30/2015

## 2015-08-22 ENCOUNTER — Encounter (HOSPITAL_COMMUNITY): Payer: Self-pay | Admitting: Psychology

## 2015-08-22 ENCOUNTER — Ambulatory Visit (HOSPITAL_COMMUNITY): Payer: Self-pay | Admitting: Psychology

## 2015-08-22 NOTE — Progress Notes (Signed)
Adam Lyons is a 29 y.o. male patient who didn't show for his appointment.  Letter sent.Marland Kitchen        Forde Radon, LPC

## 2016-01-31 ENCOUNTER — Encounter (HOSPITAL_COMMUNITY): Payer: Self-pay | Admitting: Psychology

## 2016-01-31 NOTE — Progress Notes (Signed)
Adam Lyons is a 30 y.o. male patient discharged from counseling as last seen 07/30/15.  Outpatient Therapist Discharge Summary  Marquin Patino    02/07/1986   Admission Date: 05/04/15   Discharge Date:  01/31/16 Reason for Discharge:  Not active Diagnosis:  PTSD  Comments:  No show for f/u  Alfredo Batty, [facility]

## 2016-09-25 ENCOUNTER — Ambulatory Visit (INDEPENDENT_AMBULATORY_CARE_PROVIDER_SITE_OTHER): Payer: BLUE CROSS/BLUE SHIELD | Admitting: Physician Assistant

## 2016-09-25 VITALS — BP 130/86 | HR 71 | Temp 98.1°F | Resp 20 | Ht 70.0 in | Wt 248.6 lb

## 2016-09-25 DIAGNOSIS — B9789 Other viral agents as the cause of diseases classified elsewhere: Secondary | ICD-10-CM | POA: Diagnosis not present

## 2016-09-25 DIAGNOSIS — J069 Acute upper respiratory infection, unspecified: Secondary | ICD-10-CM

## 2016-09-25 DIAGNOSIS — K047 Periapical abscess without sinus: Secondary | ICD-10-CM

## 2016-09-25 MED ORDER — AMOXICILLIN-POT CLAVULANATE 875-125 MG PO TABS: 1.0000 | ORAL_TABLET | Freq: Two times a day (BID) | ORAL | 0 refills | Status: AC

## 2016-09-25 NOTE — Patient Instructions (Addendum)
For your cold you would benefit from pseudoephedrine long acting tabs every 12 hours.  Please see the pharmacist about this drug.   Please see a dentist roughly 5 days after starting the antibiotic.    IF you received an x-ray today, you will receive an invoice from [facility]. Please contact [facility] at [phone] with questions or concerns regarding your invoice.   IF you received labwork today, you will receive an invoice from [facility]. Please contact [facility] at [phone] with questions or concerns regarding your invoice.   Our billing staff will not be able to assist you with questions regarding bills from these companies.  You will be contacted with the lab results as soon as they are available. The fastest way to get your results is to activate your My Chart account. Instructions are located on the last page of this paperwork. If you have not heard from us regarding the results in 2 weeks, please contact this office.

## 2016-09-25 NOTE — Progress Notes (Signed)
By signing my name below, I, Adam Lyons, attest that this documentation has been prepared under the direction and in the presence of Deliah Boston, PA-C.  Electronically Signed: Arvilla Market, Medical Scribe. 09/25/16. 12:10 PM.  09/25/2016 8:18 PM   DOB: 06/07/1986 / MRN: [id number]  SUBJECTIVE:  Adam Lyons is a 30 y.o. male presenting for dental pain and coughing.  Dental Pain: Pt broke a part of his tooth while eating 3 days ago and mentions he cracked his right back bottom tooth in half a month ago. Pt reports bleeding from the dental pain and reports getting 3-4 hours of sleep in the past 3 days. Pt has put putty on top of his tooth when he initially cracked it a month ago and recently has been taking Advil 600-800mg  every 6 hours for relief of his symptoms. Pt hasn't set up a dental appt yet. Pt has been smoking about a pack a day for 16 years. Pt denies fever, and pruritic drainage from dental site.  Cough: Pt initially felt lower right jaw pain Monday night (3 days ago). Pt had heat and cold intolerance the next day went he worked outside and reports associated symptoms of sinus pain, HA, SOB to prevent coughing, wet coughing with clear spututm, postnasal drainage. Pt also had itchy eyes yesterday but suspects its due to his old contacts. Pt denies ear pain, hemoptysis, and leg swelling.  He has No Known Allergies.   He  has a past medical history of Anxiety; Bipolar disorder (HCC); Chronic depression; Generalized anxiety disorder; Hand fracture, right (02/20/15); and PTSD (post-traumatic stress disorder).    He  reports that he has been smoking Cigarettes.  He has been smoking about 2.00 packs per day. He does not have any smokeless tobacco history on file. He reports that he drinks alcohol. He reports that he uses drugs, including Marijuana. He  reports that he currently engages in sexual activity. The patient  has a past surgical history that includes Tonsillectomy and Hand  surgery (Right).  His family history includes Alcohol abuse in his brother and father; Anxiety disorder in his father and mother; Depression in his father; OCD in his mother; Suicidality in his maternal grandfather.  Review of Systems  Constitutional: Negative for fever.  HENT: Negative for ear pain.        Positive for dental pain Negative with pruritic dental drainage   Respiratory: Positive for cough, sputum production and shortness of breath. Negative for hemoptysis.   Cardiovascular: Negative for leg swelling.  Neurological: Positive for headaches.  Psychiatric/Behavioral: The patient has insomnia (sleep disturbance).     The problem list and medications were reviewed and updated by myself where necessary and exist elsewhere in the encounter.   OBJECTIVE:  BP 130/86 (BP Location: Right Arm, Patient Position: Sitting, Cuff Size: Large)    Pulse 71    Temp 98.1 F (36.7 C) (Oral)    Resp 20    Ht 5\' 10"  (1.778 m)    Wt 248 lb 9.6 oz (112.8 kg)    SpO2 98%    BMI 35.67 kg/m   Physical Exam  Constitutional: He appears well-developed and well-nourished. No distress.  HENT:  Head: Normocephalic and atraumatic.  Right Ear: Tympanic membrane and ear canal normal.  Left Ear: Tympanic membrane and ear canal normal.  Nose: Mucosal edema present.  Mouth/Throat: Oropharynx is clear and moist.  Rt side nasal mocosal swelling, no erythema Erythema about gumline of back lower right molar Tenderness about  the tooth and the gums  Eyes: Conjunctivae are normal.  Neck: Neck supple.  Cardiovascular: Normal rate, regular rhythm and normal heart sounds.  Exam reveals no gallop and no friction rub.   No murmur heard. Pulmonary/Chest: Effort normal and breath sounds normal. No respiratory distress. He has no wheezes. He has no rales.  Neurological: He is alert.  Skin: Skin is warm and dry.  Psychiatric: He has a normal mood and affect. His behavior is normal.  Nursing note and vitals  reviewed.  No results found for this or any previous visit (from the past 72 hour(s)).  No results found.  ASSESSMENT AND PLAN  Adam BoomDaniel was seen today for cough, wheezing and dental pain.  Diagnoses and all orders for this visit:  Dental abscess: Will go ahead and treat him for an infection.  Advised ibuprofen 600-800 mg q8 along with tylenol 1000 mg q8.  Advised he make an appointment with a dentist  Before the antibiotic runs out.  -     amoxicillin-clavulanate (AUGMENTIN) 875-125 MG tablet; Take 1 tablet by mouth 2 (two) times daily.  Viral URI with cough: Advised pseudoephedrine. Exam reassuring.      The patient is advised to call or return to clinic if he does not see an improvement in symptoms, or to seek the care of the closest emergency department if he worsens with the above plan.   This note was scribed in my presence and I performed the services described in the this documentation.   Deliah BostonMichael Clark, MHS, PA-C [facility] 09/25/2016 8:18 PM

## 2016-10-10 ENCOUNTER — Telehealth: Payer: Self-pay

## 2016-10-10 DIAGNOSIS — Z0271 Encounter for disability determination: Secondary | ICD-10-CM

## 2016-10-10 NOTE — Telephone Encounter (Signed)
Patient needs FMLA forms completed by Adam BostonMichael Lyons, from his last OV with a tooth abscess and URI. I have completed the forms and highlighted where you need to sign. I will place the forms in your box on 10/10/16 if you could please return them to the FMLA/Disability box at the 102 checkout desk within 5-7 business days. Thank you!

## 2016-10-13 NOTE — Telephone Encounter (Signed)
Paperwork scanned in and faxed to company on 10/13/16

## 2017-01-28 ENCOUNTER — Emergency Department (HOSPITAL_COMMUNITY)
Admission: EM | Admit: 2017-01-28 | Discharge: 2017-01-28 | Disposition: A | Discharge status: Home / Self Care | Payer: BLUE CROSS/BLUE SHIELD | Attending: Emergency Medicine | Admitting: Emergency Medicine

## 2017-01-28 ENCOUNTER — Encounter (HOSPITAL_COMMUNITY): Payer: Self-pay | Admitting: Emergency Medicine

## 2017-01-28 ENCOUNTER — Emergency Department (HOSPITAL_COMMUNITY): Payer: BLUE CROSS/BLUE SHIELD

## 2017-01-28 DIAGNOSIS — Z79899 Other long term (current) drug therapy: Secondary | ICD-10-CM | POA: Insufficient documentation

## 2017-01-28 DIAGNOSIS — R0789 Other chest pain: Secondary | ICD-10-CM | POA: Diagnosis not present

## 2017-01-28 DIAGNOSIS — R079 Chest pain, unspecified: Secondary | ICD-10-CM

## 2017-01-28 DIAGNOSIS — F17210 Nicotine dependence, cigarettes, uncomplicated: Secondary | ICD-10-CM | POA: Insufficient documentation

## 2017-01-28 DIAGNOSIS — I2510 Atherosclerotic heart disease of native coronary artery without angina pectoris: Secondary | ICD-10-CM | POA: Diagnosis not present

## 2017-01-28 LAB — I-STAT CHEM 8, ED
BUN: 12 mg/dL (ref 6–20)
CALCIUM ION: 1.14 mmol/L — AB (ref 1.15–1.40)
CREATININE: 0.9 mg/dL (ref 0.61–1.24)
Chloride: 104 mmol/L (ref 101–111)
Glucose, Bld: 115 mg/dL — ABNORMAL HIGH (ref 65–99)
HEMATOCRIT: 46 % (ref 39.0–52.0)
HEMOGLOBIN: 15.6 g/dL (ref 13.0–17.0)
Potassium: 3.5 mmol/L (ref 3.5–5.1)
Sodium: 140 mmol/L (ref 135–145)
TCO2: 27 mmol/L (ref 0–100)

## 2017-01-28 LAB — I-STAT TROPONIN, ED: TROPONIN I, POC: 0 ng/mL (ref 0.00–0.08)

## 2017-01-28 NOTE — ED Triage Notes (Signed)
Patient reports left sided chest tightness radiating to left back with lightheadedness x2 weeks. Denies N/V.

## 2017-01-28 NOTE — ED Provider Notes (Signed)
[facility]-EMERGENCY DEPT Provider Note   CSN: [id number] Arrival date & time: 01/28/17  1306     History   Chief Complaint Chief Complaint  Patient presents with  . Chest Pain    HPI Surya Schroeter is a 31 y.o. male.  The history is provided by the patient.  Chest Pain   This is a new problem. Episode onset: 2 weeks ago. The problem occurs constantly. The problem has not changed since onset.The pain is present in the lateral region (left). The pain is moderate. The quality of the pain is described as sharp. The pain radiates to the upper back. Pertinent negatives include no abdominal pain, no cough, no diaphoresis, no fever, no hemoptysis, no shortness of breath and no vomiting. Treatments tried: motrin 600 mg every 6 hours. The treatment provided no relief. Risk factors include smoking/tobacco exposure, obesity and male gender.  Pertinent negatives for past medical history include no diabetes and no hypertension.  His family medical history is significant for CAD (father and sister with CAD in 53s).    Past Medical History:  Diagnosis Date  . Anxiety   . Bipolar disorder (HCC)   . Chronic depression   . Generalized anxiety disorder   . Hand fracture, right 02/20/15  . PTSD (post-traumatic stress disorder)     Patient Active Problem List   Diagnosis Date Noted  . PTSD (post-traumatic stress disorder) 05/04/2015  . Anxiety 05/04/2015  . Cannabis use disorder, moderate, dependence (HCC) 04/13/2015  . OD (overdose of drug) 04/13/2015  . Tobacco use disorder 04/09/2015  . Major depressive disorder, recurrent severe without psychotic features (HCC) 03/16/2015    Past Surgical History:  Procedure Laterality Date  . HAND SURGERY Right   . TONSILLECTOMY         Home Medications    Prior to Admission medications   Medication Sig Start Date End Date Taking? Authorizing Provider  amoxicillin-clavulanate (AUGMENTIN) 875-125 MG tablet Take 1 tablet by mouth 2 (two) times daily.  09/25/16   Ofilia Neas, PA-C  busPIRone (BUSPAR) 10 MG tablet Take 15 mg by mouth 3 (three) times daily.     Thedore Mins, MD  clonazePAM (KLONOPIN) 1 MG tablet Take 1 mg by mouth 2 (two) times daily.    Historical Provider, MD  DULoxetine (CYMBALTA) 30 MG capsule Take 1 capsule (30 mg total) by mouth daily. Patient not taking: Reported on 09/25/2016 04/14/15   Shuvon B Rankin, NP  gabapentin (NEURONTIN) 100 MG capsule Take 2 capsules (200 mg total) by mouth 2 (two) times daily. Patient not taking: Reported on 09/25/2016 04/14/15   Shuvon B Rankin, NP  hydrOXYzine (ATARAX/VISTARIL) 25 MG tablet Take 1 tablet (25 mg total) by mouth every 6 (six) hours as needed (anxiety/agitation). Patient not taking: Reported on 09/25/2016 04/14/15   Rada Hay Rankin, NP  traMADol (ULTRAM) 50 MG tablet  03/02/15   Historical Provider, MD  traZODone (DESYREL) 50 MG tablet Take 1 tablet (50 mg total) by mouth at bedtime as needed for sleep. 04/14/15   Shuvon B Rankin, NP    Family History Family History  Problem Relation Age of Onset  . Depression Father   . Anxiety disorder Father   . Alcohol abuse Father   . Suicidality Maternal Grandfather   . OCD Mother   . Anxiety disorder Mother   . Alcohol abuse Brother     Social History Social History  Substance Use Topics  . Smoking status: Current Every Day Smoker  Packs/day: 2.00    Types: Cigarettes  . Smokeless tobacco: Never Used  . Alcohol use Yes     Comment: "socially"     Allergies   Patient has no known allergies.   Review of Systems Review of Systems  Constitutional: Negative for diaphoresis and fever.  Respiratory: Negative for cough, hemoptysis and shortness of breath.   Cardiovascular: Positive for chest pain.  Gastrointestinal: Negative for abdominal pain and vomiting.  All other systems reviewed and are negative.    Physical Exam Updated Vital Signs BP (!) 160/101 (BP Location: Right Arm)   Pulse 95   Temp 98.2 F (36.8  C) (Oral)   Resp 15   SpO2 99%   Physical Exam  Constitutional: He is oriented to person, place, and time. He appears well-developed and well-nourished. No distress.  HENT:  Head: Normocephalic and atraumatic.  Nose: Nose normal.  Eyes: Conjunctivae are normal.  Neck: Neck supple. No tracheal deviation present.  Cardiovascular: Normal rate, regular rhythm and normal heart sounds.   Pulmonary/Chest: Effort normal. No respiratory distress. He has no wheezes. He exhibits no tenderness.  Abdominal: Soft. He exhibits no distension. There is no tenderness.  Neurological: He is alert and oriented to person, place, and time.  Skin: Skin is warm and dry.  Psychiatric: He has a normal mood and affect.  Vitals reviewed.    ED Treatments / Results  Labs (all labs ordered are listed, but only abnormal results are displayed) Labs Reviewed  I-STAT CHEM 8, ED - Abnormal; Notable for the following:       Result Value   Glucose, Bld 115 (*)    Calcium, Ion 1.14 (*)    All other components within normal limits  I-STAT TROPOININ, ED    EKG  EKG Interpretation  Date/Time:  Wednesday January 28 2017 13:14:34 EST Ventricular Rate:  81 PR Interval:    QRS Duration: 103 QT Interval:  379 QTC Calculation: 440 R Axis:   51 Text Interpretation:  Sinus rhythm Normal ECG Confirmed by Terrance Lanahan MD, Mattia [phone]) on 01/28/2017 1:22:04 PM       Radiology Dg Chest 2 View  Result Date: 01/28/2017 CLINICAL DATA:  Chest pain and shortness of breath EXAM: CHEST  2 VIEW COMPARISON:  Chest radiograph June 12, 2013 FINDINGS: There is no edema or consolidation. Heart size and pulmonary vascularity are normal. No adenopathy. There is prominence of epicardial fat at the right heart border anteriorly. No bone lesions. No pneumothorax. IMPRESSION: Stable prominence of epicardial fat anteriorly at the right base. No edema or consolidation. Stable cardiac silhouette. Electronically Signed   By: Bretta Bang  III M.D.   On: 01/28/2017 14:42    Procedures Procedures (including critical care time)  Medications Ordered in ED Medications - No data to display   Initial Impression / Assessment and Plan / ED Course  I have reviewed the triage vital signs and the nursing notes.  Pertinent labs & imaging results that were available during my care of the patient were reviewed by me and considered in my medical decision making (see chart for details).     31 y.o. male presents with left sided chest pain over the last 2 weeks. Describes as sharp and tightness. CXR unremarkable. Does have strong family history of sister and father with early-onset CAD. Troponin and EKG unremarkable here. Heart score is 2 from risk factors with ongoing pain for multiple weeks. Doubt ACS. The patient is PERC negative. Discussed possibility of MSK versus  anxiety component as he feels this might contribute. He is very concerned about his blood pressure being up and I discussed the need for appropriate outpatient follow up if this is persistent but it is likely d/t his pain and resolves after a brief observation in the ED. Patient was recommended to take short course of scheduled NSAIDs and engage in early mobility as definitive treatment. Plan to follow up with PCP as needed and return precautions discussed for worsening or new concerning symptoms.   Final Clinical Impressions(s) / ED Diagnoses   Final diagnoses:  Nonspecific chest pain    New Prescriptions New Prescriptions   No medications on file     Lyndal Pulleyaniel Marketta Valadez, MD 01/28/17 [phone]

## 2017-01-28 NOTE — ED Notes (Signed)
ED Provider at bedside. 

## 2017-07-22 IMAGING — CR DG CHEST 2V
2 series · 2 of 2 positions shown · non-contrast
Comparison: Chest radiograph June 12, 2013

CLINICAL DATA: Chest pain and shortness of breath

EXAM:
CHEST  2 VIEW

[w chest pa]
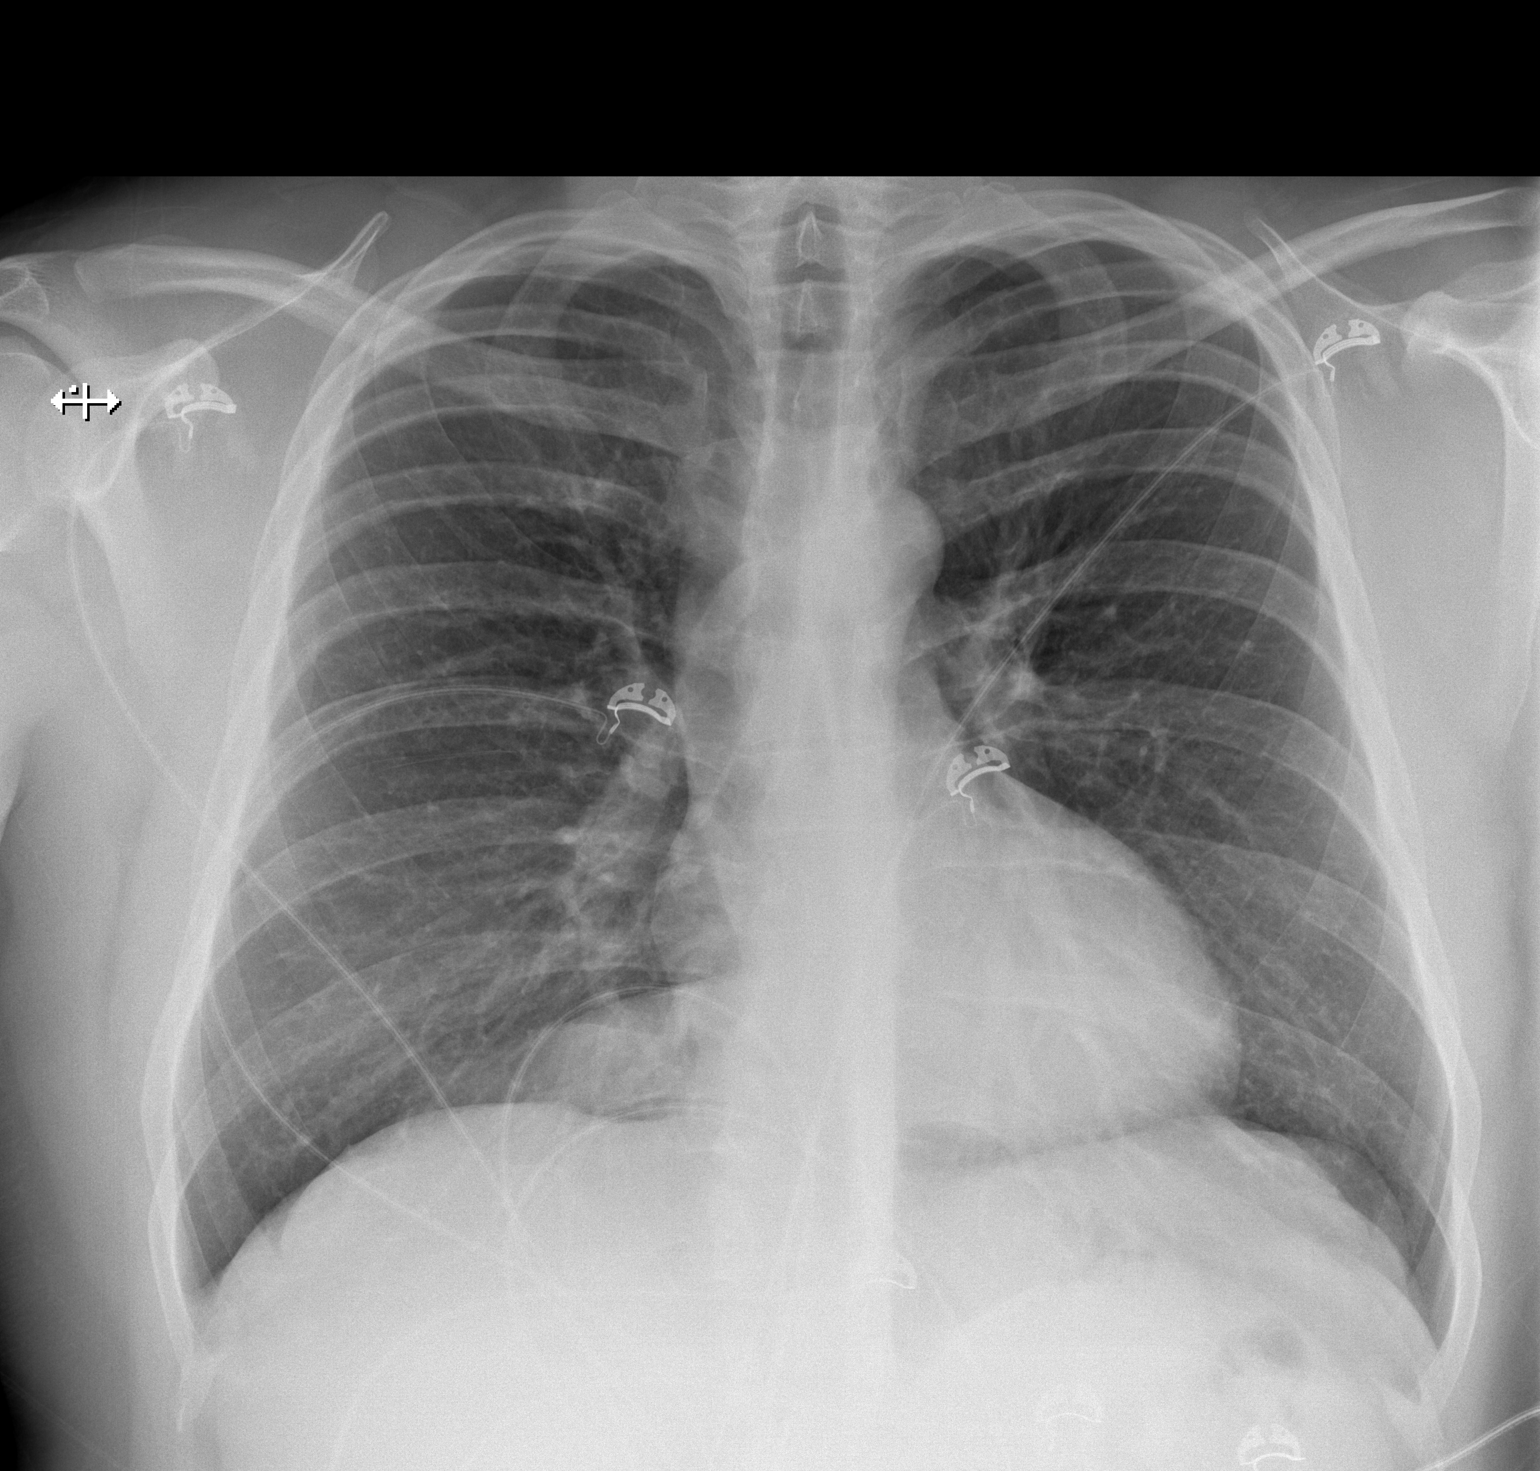

[w chest lat]
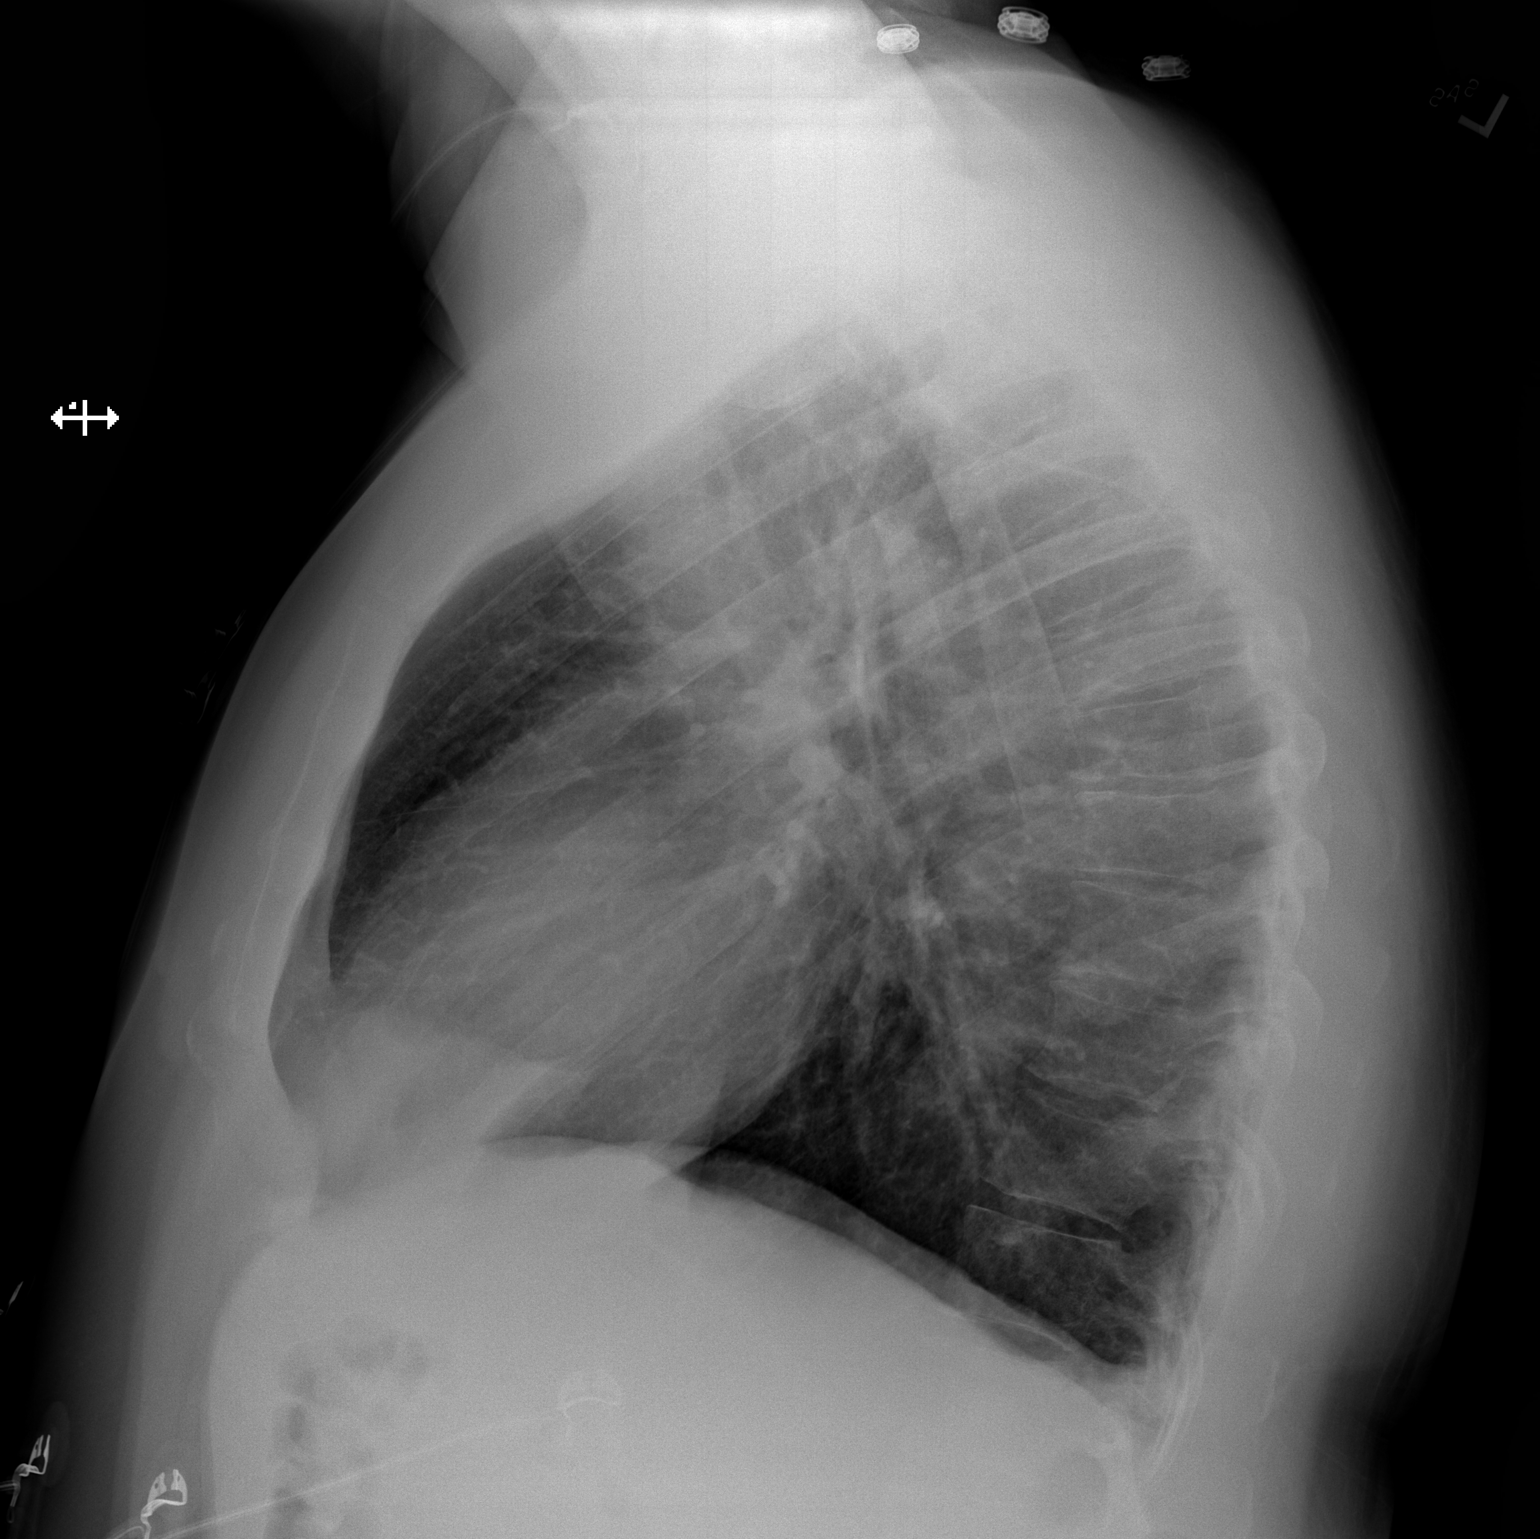

[2 of 2 positions shown; findings below may reference images not displayed]

FINDINGS: There is no edema or consolidation. Heart size and pulmonary
vascularity are normal. No adenopathy. There is prominence of
epicardial fat at the right heart border anteriorly. No bone
lesions. No pneumothorax.
IMPRESSION: Stable prominence of epicardial fat anteriorly at the right base. No
edema or consolidation. Stable cardiac silhouette.

## 2017-08-01 IMAGING — US US SCROTUM
1 series · 14 of 25 positions shown · non-contrast
Comparison: None.

CLINICAL DATA: Left lower quadrant pain and left testicular pain.
Some low back and left flank pain. Evaluate for testicular torsion.

EXAM:
SCROTAL ULTRASOUND
DOPPLER ULTRASOUND OF THE TESTICLES
TECHNIQUE: Complete ultrasound examination of the testicles, epididymis, and
other scrotal structures was performed. Color and spectral Doppler
ultrasound were also utilized to evaluate blood flow to the
testicles.

[Series 1: us scrotum · 0.06mm/px · 14 of 27 slices shown]
[im 1/27]
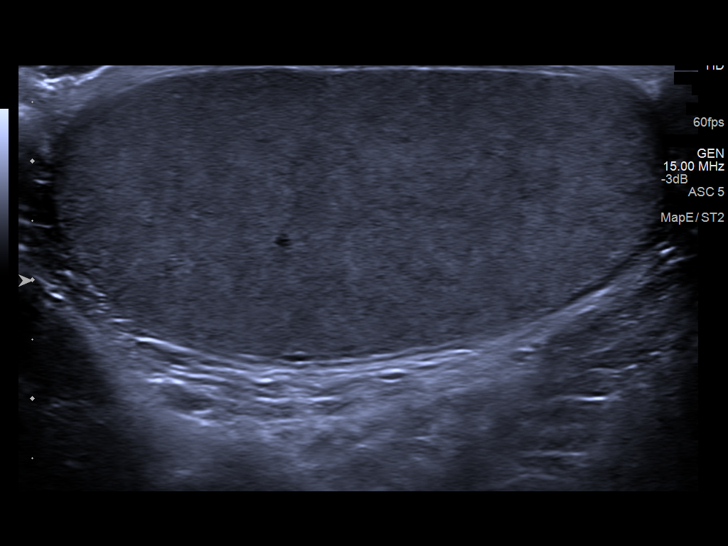
[im 3/27]
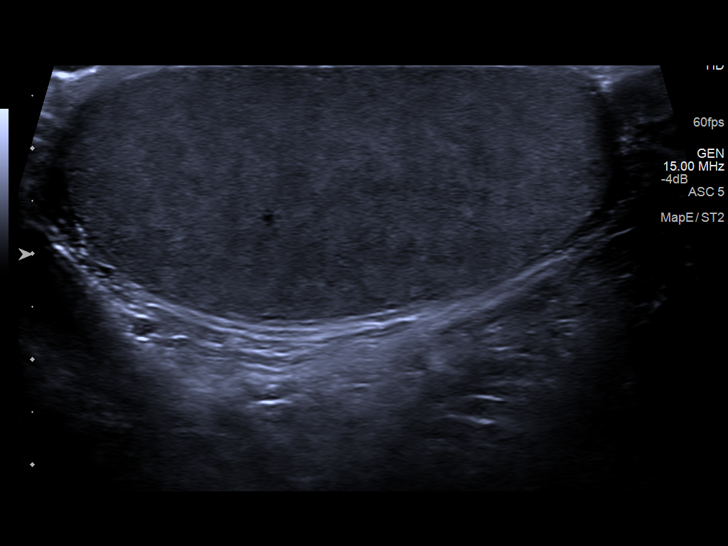
[im 5/27]
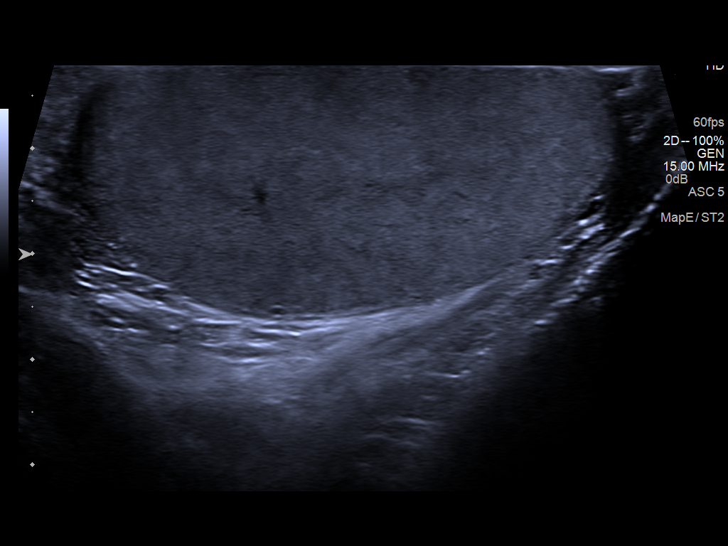
[im 7/27]
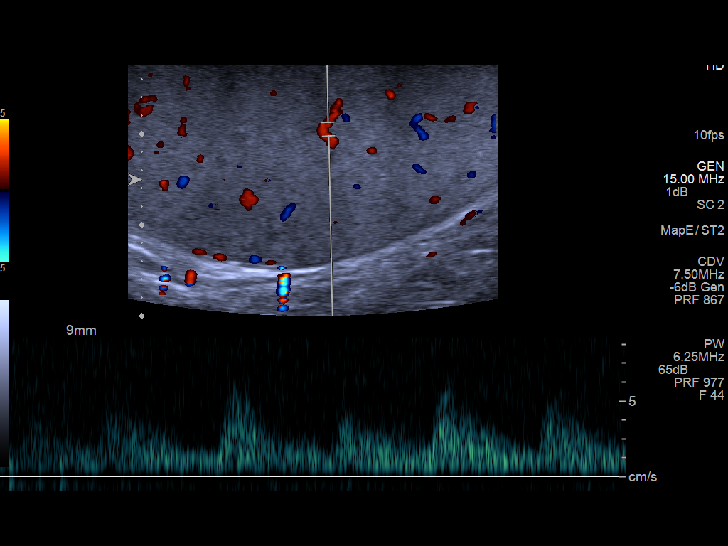
[im 9/27]
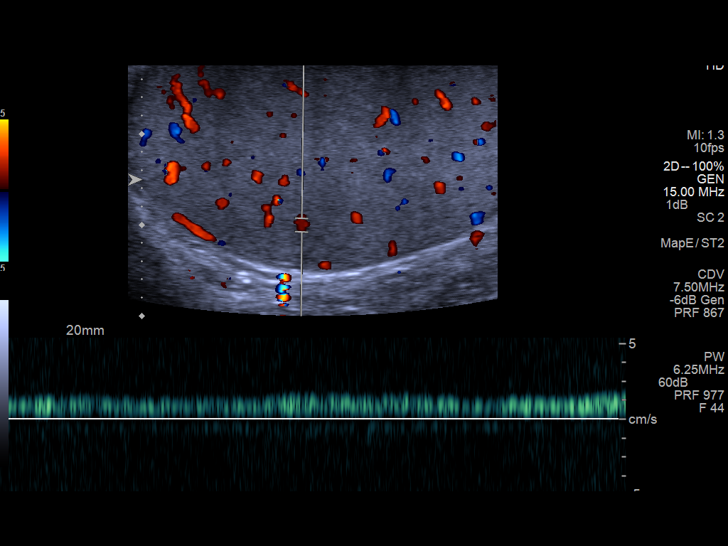
[im 10/27]
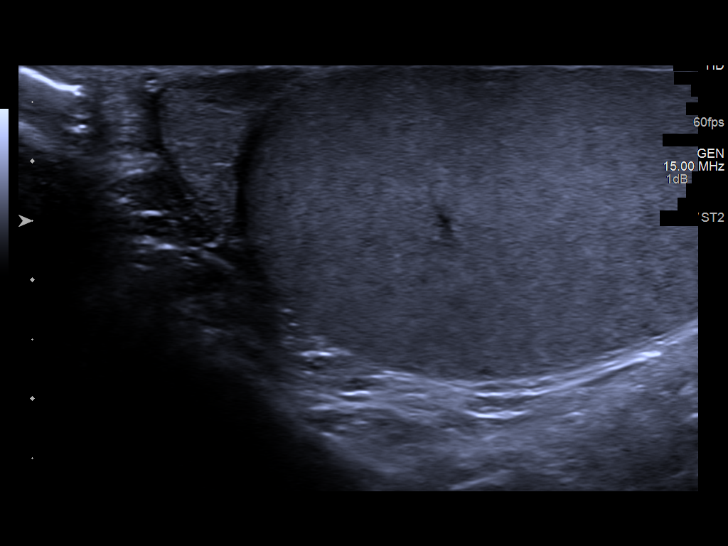
[im 12/27]
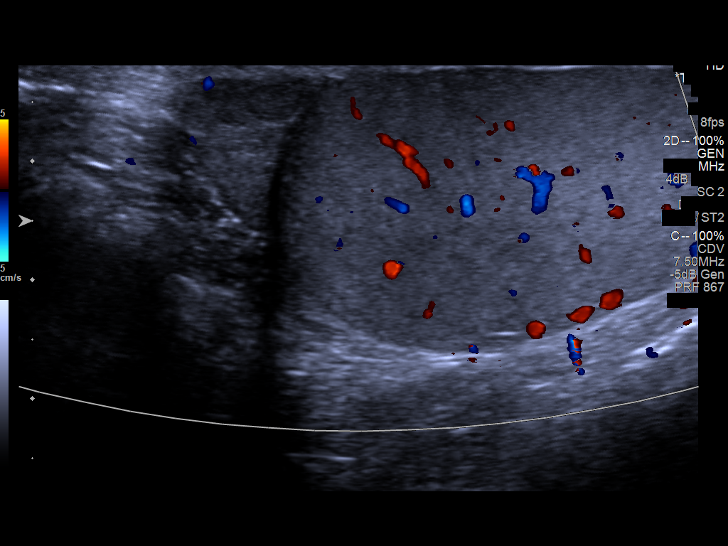
[im 15/27]
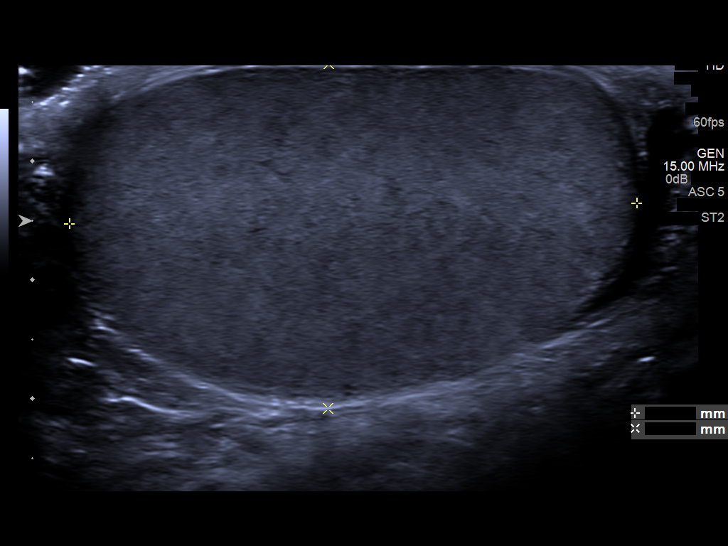
[im 17/27]
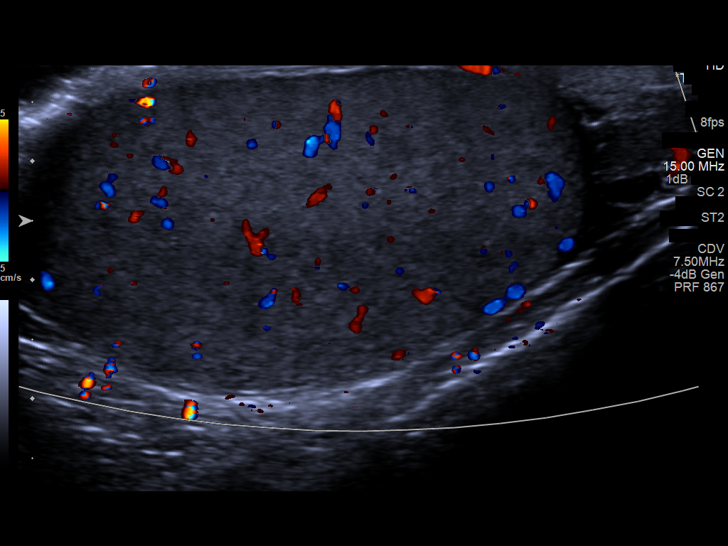
[im 18/27]
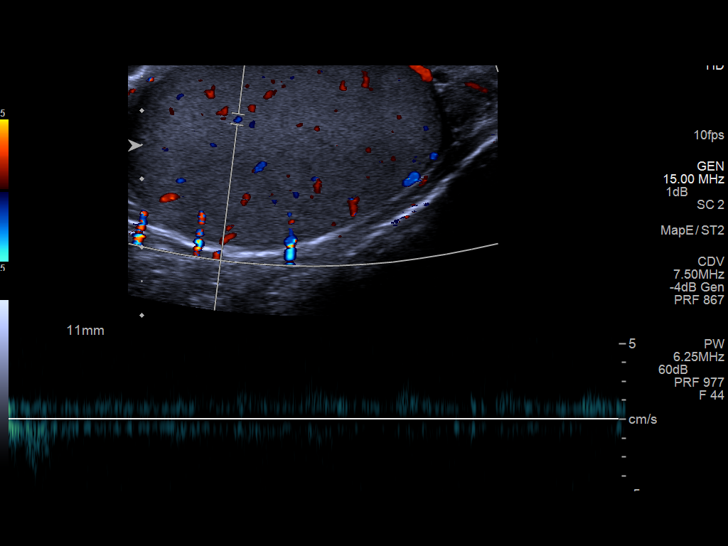
[im 20/27]
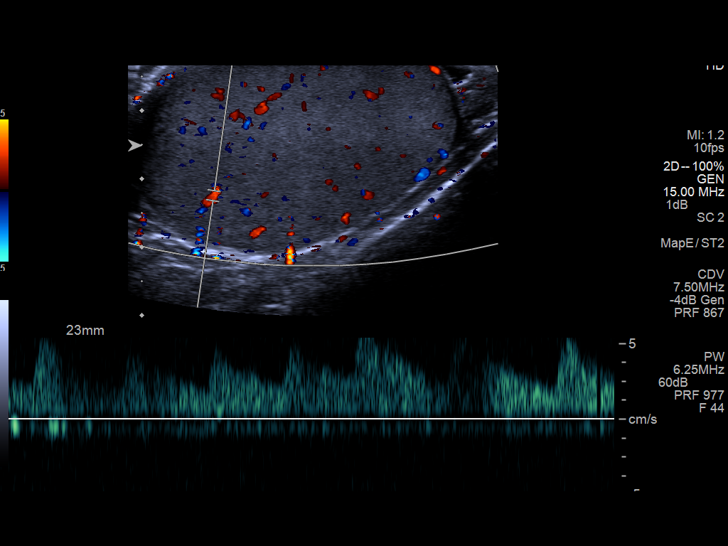
[im 22/27]
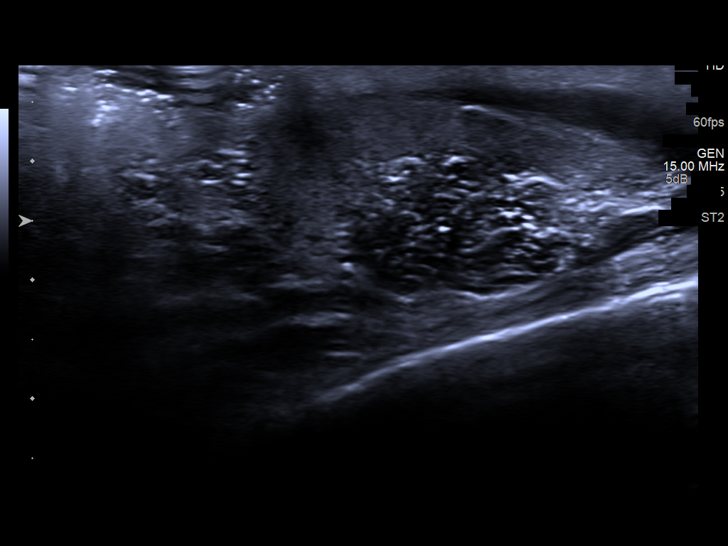
[im 24/27]
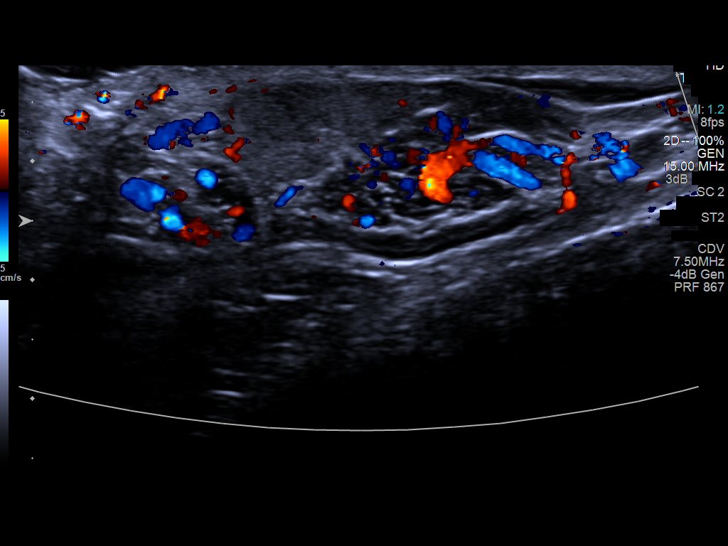
[im 27/27]
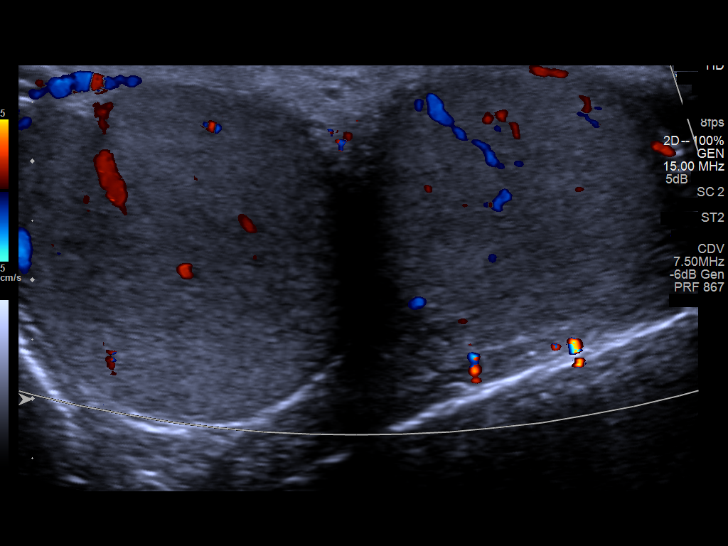

[14 of 25 positions shown; findings below may reference images not displayed]

FINDINGS: Right testicle

Measurements: 2.6 x 2.9 x 5.2 cm. No mass or microlithiasis
visualized.

Left testicle

Measurements: 2.9 x 3.6 x 4.8 cm. No mass or microlithiasis
visualized.

Right epididymis:  Normal in size and appearance.

Left epididymis:  Normal in size and appearance.

Hydrocele:  None visualized.

Varicocele:  None visualized.

Pulsed Doppler interrogation of both testes demonstrates normal low
resistance arterial and venous waveforms bilaterally.
IMPRESSION: Normal testicular ultrasound.  No evidence of torsion.

## 2020-03-22 ENCOUNTER — Other Ambulatory Visit: Payer: Self-pay | Admitting: Family Medicine

## 2020-05-02 ENCOUNTER — Other Ambulatory Visit: Payer: Self-pay | Admitting: Family Medicine

## 2023-11-15 ENCOUNTER — Emergency Department (HOSPITAL_COMMUNITY): Payer: BC Managed Care – PPO

## 2023-11-15 ENCOUNTER — Encounter (HOSPITAL_COMMUNITY): Payer: Self-pay

## 2023-11-15 ENCOUNTER — Emergency Department (HOSPITAL_COMMUNITY)
Admission: EM | Admit: 2023-11-15 | Discharge: 2023-11-15 | Disposition: A | Discharge status: Home / Self Care | Payer: BC Managed Care – PPO | Attending: Emergency Medicine | Admitting: Emergency Medicine

## 2023-11-15 ENCOUNTER — Other Ambulatory Visit: Payer: Self-pay

## 2023-11-15 DIAGNOSIS — I10 Essential (primary) hypertension: Secondary | ICD-10-CM | POA: Insufficient documentation

## 2023-11-15 DIAGNOSIS — K5732 Diverticulitis of large intestine without perforation or abscess without bleeding: Secondary | ICD-10-CM | POA: Insufficient documentation

## 2023-11-15 DIAGNOSIS — R1030 Lower abdominal pain, unspecified: Secondary | ICD-10-CM | POA: Diagnosis present

## 2023-11-15 DIAGNOSIS — K5792 Diverticulitis of intestine, part unspecified, without perforation or abscess without bleeding: Secondary | ICD-10-CM

## 2023-11-15 DIAGNOSIS — Z79899 Other long term (current) drug therapy: Secondary | ICD-10-CM | POA: Diagnosis not present

## 2023-11-15 DIAGNOSIS — D72829 Elevated white blood cell count, unspecified: Secondary | ICD-10-CM | POA: Diagnosis not present

## 2023-11-15 HISTORY — DX: Essential (primary) hypertension: I10

## 2023-11-15 LAB — URINALYSIS, ROUTINE W REFLEX MICROSCOPIC
Bilirubin Urine: NEGATIVE
Glucose, UA: NEGATIVE mg/dL
Hgb urine dipstick: NEGATIVE
Ketones, ur: 20 mg/dL — AB
Leukocytes,Ua: NEGATIVE
Nitrite: NEGATIVE
Protein, ur: 30 mg/dL — AB
Specific Gravity, Urine: 1.029 (ref 1.005–1.030)
pH: 5 (ref 5.0–8.0)

## 2023-11-15 LAB — CBC WITH DIFFERENTIAL/PLATELET
Abs Immature Granulocytes: 0.08 10*3/uL — ABNORMAL HIGH (ref 0.00–0.07)
Basophils Absolute: 0 10*3/uL (ref 0.0–0.1)
Basophils Relative: 0 %
Eosinophils Absolute: 0.1 10*3/uL (ref 0.0–0.5)
Eosinophils Relative: 1 %
HCT: 49.8 % (ref 39.0–52.0)
Hemoglobin: 16.1 g/dL (ref 13.0–17.0)
Immature Granulocytes: 1 %
Lymphocytes Relative: 18 %
Lymphs Abs: 2.8 10*3/uL (ref 0.7–4.0)
MCH: 27.2 pg (ref 26.0–34.0)
MCHC: 32.3 g/dL (ref 30.0–36.0)
MCV: 84 fL (ref 80.0–100.0)
Monocytes Absolute: 1.4 10*3/uL — ABNORMAL HIGH (ref 0.1–1.0)
Monocytes Relative: 9 %
Neutro Abs: 11.1 10*3/uL — ABNORMAL HIGH (ref 1.7–7.7)
Neutrophils Relative %: 71 %
Platelets: 246 10*3/uL (ref 150–400)
RBC: 5.93 MIL/uL — ABNORMAL HIGH (ref 4.22–5.81)
RDW: 13.8 % (ref 11.5–15.5)
WBC: 15.5 10*3/uL — ABNORMAL HIGH (ref 4.0–10.5)
nRBC: 0 % (ref 0.0–0.2)

## 2023-11-15 LAB — COMPREHENSIVE METABOLIC PANEL
ALT: 15 U/L (ref 0–44)
AST: 13 U/L — ABNORMAL LOW (ref 15–41)
Albumin: 4 g/dL (ref 3.5–5.0)
Alkaline Phosphatase: 60 U/L (ref 38–126)
Anion gap: 10 (ref 5–15)
BUN: 17 mg/dL (ref 6–20)
CO2: 22 mmol/L (ref 22–32)
Calcium: 9.1 mg/dL (ref 8.9–10.3)
Chloride: 104 mmol/L (ref 98–111)
Creatinine, Ser: 0.98 mg/dL (ref 0.61–1.24)
GFR, Estimated: >60 mL/min (ref >60)
Glucose, Bld: 115 mg/dL — ABNORMAL HIGH (ref 70–99)
Potassium: 3.5 mmol/L (ref 3.5–5.1)
Sodium: 136 mmol/L (ref 135–145)
Total Bilirubin: 1.3 mg/dL — ABNORMAL HIGH (ref <1.2)
Total Protein: 8.4 g/dL — ABNORMAL HIGH (ref 6.5–8.1)

## 2023-11-15 LAB — LIPASE, BLOOD: Lipase: 29 U/L (ref 11–51)

## 2023-11-15 MED ORDER — ALIGN 4 MG PO CAPS: 1.0000 | ORAL_CAPSULE | Freq: Two times a day (BID) | ORAL | 0 refills | Status: AC

## 2023-11-15 MED ORDER — IOHEXOL 300 MG/ML  SOLN
100.0000 mL | Freq: Once | INTRAMUSCULAR | Status: AC | PRN
  Administered 2023-11-15: 100 mL via INTRAVENOUS

## 2023-11-15 MED ORDER — ONDANSETRON HCL 4 MG PO TABS: 4.0000 mg | ORAL_TABLET | Freq: Four times a day (QID) | ORAL | 0 refills | Status: AC

## 2023-11-15 MED ORDER — MORPHINE SULFATE (PF) 4 MG/ML IV SOLN
4.0000 mg | Freq: Once | INTRAVENOUS | Status: AC
  Administered 2023-11-15: 4 mg via INTRAVENOUS
  Filled 2023-11-15: qty 1

## 2023-11-15 MED ORDER — AMOXICILLIN-POT CLAVULANATE 875-125 MG PO TABS: 1.0000 | ORAL_TABLET | Freq: Two times a day (BID) | ORAL | 0 refills | Status: AC

## 2023-11-15 MED ORDER — OXYCODONE-ACETAMINOPHEN 5-325 MG PO TABS: 1.0000 | ORAL_TABLET | Freq: Four times a day (QID) | ORAL | 0 refills | Status: AC | PRN

## 2023-11-15 MED ORDER — METRONIDAZOLE 500 MG/100ML IV SOLN
500.0000 mg | Freq: Once | INTRAVENOUS | Status: AC
  Administered 2023-11-15: 500 mg via INTRAVENOUS
  Filled 2023-11-15: qty 100

## 2023-11-15 MED ORDER — SODIUM CHLORIDE 0.9 % IV SOLN
1.0000 g | Freq: Once | INTRAVENOUS | Status: AC
  Administered 2023-11-15: 1 g via INTRAVENOUS
  Filled 2023-11-15: qty 10

## 2023-11-15 NOTE — ED Notes (Signed)
Pt has red top save tube in lab.

## 2023-11-15 NOTE — ED Provider Notes (Signed)
Pt signed out by Dr. Earlene Plater pending symptomatic improvement.  Pt does have diverticulitis.  Pt is still having pain, but it is tolerable.  He is able to tolerate po fluids.  He is stable for d/c.  Return if worse.  F/u with pcp.    Jacalyn Lefevre, MD 11/15/23 [phone]

## 2023-11-15 NOTE — ED Provider Notes (Signed)
[place] EMERGENCY DEPARTMENT AT [facility] Provider Note   CSN: [id number] Arrival date & time: 11/15/23  0454     History  Chief Complaint  Patient presents with   Abdominal Pain    Adam Lyons is a 37 y.o. male.   Abdominal Pain 36 year old male history of hypertension, anxiety, bipolar disorder presenting for abdominal pain.  Patient states he has had lower abdominal pain across his lower abdomen for about 3 days.  He said little bowel movements at the same time but is passing gas.  No nausea or vomiting.  Decreased p.o. intake.  Fever yesterday, not febrile here.  No chills.  No urinary symptoms.  No chest pain or shortness of breath.  Had episode like this several weeks ago for about 6 days that resolved after he had bowel movements.  He has otherwise been at his baseline health.  Still has his gallbladder and appendix.     Home Medications Prior to Admission medications   Medication Sig Start Date End Date Taking? Authorizing Provider  amoxicillin (AMOXIL) 500 MG capsule Take 500 mg by mouth 2 (two) times daily. Patient not taking: Reported on 11/15/2023 09/25/23   [provider]  amoxicillin-clavulanate (AUGMENTIN) 875-125 MG tablet Take 1 tablet by mouth 2 (two) times daily. Patient not taking: Reported on 11/15/2023 09/25/16   Ofilia Neas, PA-C  busPIRone (BUSPAR) 10 MG tablet Take 15 mg by mouth 3 (three) times daily.  Patient not taking: Reported on 11/15/2023    Thedore Mins, MD  clonazePAM (KLONOPIN) 1 MG tablet Take 1 mg by mouth 2 (two) times daily. Patient not taking: Reported on 11/15/2023    [provider]  DULoxetine (CYMBALTA) 30 MG capsule Take 1 capsule (30 mg total) by mouth daily. Patient not taking: Reported on 09/25/2016 04/14/15   Rankin, Shuvon B, NP  gabapentin (NEURONTIN) 100 MG capsule Take 2 capsules (200 mg total) by mouth 2 (two) times daily. Patient not taking: Reported on 09/25/2016 04/14/15    Rankin, Shuvon B, NP  hydrOXYzine (ATARAX/VISTARIL) 25 MG tablet Take 1 tablet (25 mg total) by mouth every 6 (six) hours as needed (anxiety/agitation). Patient not taking: Reported on 09/25/2016 04/14/15   Rankin, Shuvon B, NP  olmesartan (BENICAR) 40 MG tablet Take 40 mg by mouth. Patient not taking: Reported on 11/15/2023 10/28/22   [provider]  traMADol Janean Sark) 50 MG tablet  03/02/15   [provider]  traZODone (DESYREL) 50 MG tablet Take 1 tablet (50 mg total) by mouth at bedtime as needed for sleep. Patient not taking: Reported on 11/15/2023 04/14/15   Rankin, Denice Bors B, NP      Allergies    Patient has no known allergies.    Review of Systems   Review of Systems  Gastrointestinal:  Positive for abdominal pain.  Review of systems completed and notable as per HPI.  ROS otherwise negative.   Physical Exam Updated Vital Signs BP (!) 168/105   Pulse 96   Temp 98.7 F (37.1 C)   Resp 18   Ht 5\' 10"  (1.778 m)   Wt 112.8 kg   SpO2 96%   BMI 35.68 kg/m  Physical Exam Vitals and nursing note reviewed.  Constitutional:      General: He is not in acute distress.    Appearance: He is well-developed.  HENT:     Head: Normocephalic and atraumatic.     Mouth/Throat:     Mouth: Mucous membranes are moist.  Pharynx: Oropharynx is clear.  Eyes:     Extraocular Movements: Extraocular movements intact.     Conjunctiva/sclera: Conjunctivae normal.     Pupils: Pupils are equal, round, and reactive to light.  Cardiovascular:     Rate and Rhythm: Normal rate and regular rhythm.     Pulses: Normal pulses.     Heart sounds: Normal heart sounds. No murmur heard. Pulmonary:     Effort: Pulmonary effort is normal. No respiratory distress.     Breath sounds: Normal breath sounds.  Abdominal:     Palpations: Abdomen is soft.     Tenderness: There is abdominal tenderness. There is no right CVA tenderness, left CVA tenderness, guarding or rebound.     Comments:  Tenderness across the lower abdomen.  Musculoskeletal:        General: No swelling.     Cervical back: Neck supple.     Right lower leg: No edema.     Left lower leg: No edema.  Skin:    General: Skin is warm and dry.     Capillary Refill: Capillary refill takes less than 2 seconds.  Neurological:     Mental Status: He is alert.  Psychiatric:        Mood and Affect: Mood normal.     ED Results / Procedures / Treatments   Labs (all labs ordered are listed, but only abnormal results are displayed) Labs Reviewed  COMPREHENSIVE METABOLIC PANEL - Abnormal; Notable for the following components:      Result Value   Glucose, Bld 115 (*)    Total Protein 8.4 (*)    AST 13 (*)    Total Bilirubin 1.3 (*)    All other components within normal limits  CBC WITH DIFFERENTIAL/PLATELET - Abnormal; Notable for the following components:   WBC 15.5 (*)    RBC 5.93 (*)    Neutro Abs 11.1 (*)    Monocytes Absolute 1.4 (*)    Abs Immature Granulocytes 0.08 (*)    All other components within normal limits  URINALYSIS, ROUTINE W REFLEX MICROSCOPIC - Abnormal; Notable for the following components:   Color, Urine AMBER (*)    APPearance HAZY (*)    Ketones, ur 20 (*)    Protein, ur 30 (*)    Bacteria, UA RARE (*)    All other components within normal limits  LIPASE, BLOOD    EKG None  Radiology CT ABDOMEN PELVIS W CONTRAST Result Date: 11/15/2023 CLINICAL DATA:  37 year old male with history of right lower quadrant abdominal pain for the past 3 days. EXAM: CT ABDOMEN AND PELVIS WITH CONTRAST TECHNIQUE: Multidetector CT imaging of the abdomen and pelvis was performed using the standard protocol following bolus administration of intravenous contrast. RADIATION DOSE REDUCTION: This exam was performed according to the departmental dose-optimization program which includes automated exposure control, adjustment of the mA and/or kV according to patient size and/or use of iterative reconstruction  technique. CONTRAST:  OMNIPAQUE IOHEXOL 300 MG/ML  SOLN COMPARISON:  No priors. FINDINGS: Lower chest: Small hiatal hernia. Hepatobiliary: No suspicious cystic or solid hepatic lesions. No intra or extrahepatic biliary ductal dilatation. Gallbladder is unremarkable in appearance. Pancreas: No pancreatic mass. No pancreatic ductal dilatation. No pancreatic or peripancreatic fluid collections or inflammatory changes. Spleen: Unremarkable. Adrenals/Urinary Tract: Bilateral kidneys and bilateral adrenal glands are normal in appearance. No hydroureteronephrosis. Urinary bladder is nearly completely decompressed, but otherwise unremarkable in appearance. Stomach/Bowel: The appearance of the stomach is normal. No pathologic dilatation of  small bowel or colon. Numerous colonic diverticuli are noted. In the region of the sigmoid colon there is extensive mural thickening and extensive surrounding inflammatory changes in the surrounding sigmoid mesocolon, indicative of acute diverticulitis. No discrete diverticular abscess or definitive findings of frank perforation or confidently identified at this time. This inflammation extends along the left pelvic sidewall where there is thickening of the peritoneal membranes extending cephalad. Normal appendix. Vascular/Lymphatic: Atherosclerotic calcifications in the pelvic vasculature. No aneurysm or dissection noted in the abdominal or pelvic vasculature. No lymphadenopathy noted in the abdomen or pelvis. Reproductive: Prostate gland and seminal vesicles are unremarkable in appearance. Other: Trace volume of ascites in the low anatomic pelvis, presumably reactive. No larger volume of ascites. No pneumoperitoneum. Musculoskeletal: There are no aggressive appearing lytic or blastic lesions noted in the visualized portions of the skeleton. IMPRESSION: 1. Acute sigmoid diverticulitis, as above. 2. Small hiatal hernia. Electronically Signed   By: Trudie Reed M.D.   On:  11/15/2023 06:35    Procedures Procedures    Medications Ordered in ED Medications  cefTRIAXone (ROCEPHIN) 1 g in sodium chloride 0.9 % 100 mL IVPB (has no administration in time range)  metroNIDAZOLE (FLAGYL) IVPB 500 mg (has no administration in time range)  iohexol (OMNIPAQUE) 300 MG/ML solution 100 mL (100 mLs Intravenous Contrast Given 11/15/23 0620)  morphine (PF) 4 MG/ML injection 4 mg (4 mg Intravenous Given 11/15/23 4270)    ED Course/ Medical Decision Making/ A&P                                 Medical Decision Making Amount and/or Complexity of Data Reviewed Labs: ordered. Radiology: ordered.  Risk Prescription drug management.   Medical Decision Making:   Adam Lyons is a 37 y.o. male who presented to the ED today with abdominal pain.  Here he is afebrile but initially tachycardic and hypertensive likely related to pain.  Nontachycardic, evaluation.  His lab was noted for leukocytosis, bilirubin slightly up.  Urinalysis without signs of infection.  He is tender across his lower abdomen, endorses some constipation.  Differential including possible constipation, diverticulitis, appendicitis.  No right upper quadrant tenderness lower concern for cholecystitis.  Seems less consistent with stone, peptic ulcer disease.  No urinary symptoms to suggest UTI.  No testicular or penile pain.   Patient placed on continuous vitals and telemetry monitoring while in ED which was reviewed periodically.  Reviewed and confirmed nursing documentation for past medical history, family history, social history.  Reassessment and Plan:   Labwork reviewed, he has leukocytosis otherwise reassuring.  Tachycardia has resolved and pain is somewhat better.  Given morphine.  CT scan is consistent with significant sigmoid diverticulitis, however no abscess or perforation.  He did have fever yesterday, but no fever here.  Will give some IV antibiotics and p.o. challenge here.  Handoff given to Dr.  Particia Nearing with plan to reassess after p.o. challenge.  Patient's presentation is most consistent with acute complicated illness / injury requiring diagnostic workup.           Final Clinical Impression(s) / ED Diagnoses Final diagnoses:  Diverticulitis    Rx / DC Orders ED Discharge Orders     None         Laurence Spates, MD 11/15/23 [phone]

## 2023-11-15 NOTE — ED Triage Notes (Signed)
Pt reports with lower abdominal pain x 3 days. Pt states that he has to use the bathroom but can't. Pt reports a fever, no fever noted in triage. Last bm 3 days ago. Pt states that this happened not too long ago and is concerned that something else is going on.
